# Patient Record
Sex: Female | Born: 1965
Health system: Southern US, Community
[De-identification: ages and names within clinical notes are randomized; demographics above are authoritative.]

## PROBLEM LIST (undated history)

## (undated) DIAGNOSIS — B009 Herpesviral infection, unspecified: Secondary | ICD-10-CM

## (undated) DIAGNOSIS — F419 Anxiety disorder, unspecified: Secondary | ICD-10-CM

## (undated) DIAGNOSIS — G5603 Carpal tunnel syndrome, bilateral upper limbs: Secondary | ICD-10-CM

## (undated) DIAGNOSIS — E785 Hyperlipidemia, unspecified: Secondary | ICD-10-CM

## (undated) DIAGNOSIS — I10 Essential (primary) hypertension: Secondary | ICD-10-CM

## (undated) DIAGNOSIS — N898 Other specified noninflammatory disorders of vagina: Secondary | ICD-10-CM

## (undated) DIAGNOSIS — R300 Dysuria: Secondary | ICD-10-CM

## (undated) DIAGNOSIS — IMO0002 Reserved for concepts with insufficient information to code with codable children: Secondary | ICD-10-CM

## (undated) DIAGNOSIS — C801 Malignant (primary) neoplasm, unspecified: Secondary | ICD-10-CM

## (undated) DIAGNOSIS — R87629 Unspecified abnormal cytological findings in specimens from vagina: Secondary | ICD-10-CM

## (undated) DIAGNOSIS — Z8719 Personal history of other diseases of the digestive system: Secondary | ICD-10-CM

## (undated) DIAGNOSIS — A630 Anogenital (venereal) warts: Secondary | ICD-10-CM

## (undated) DIAGNOSIS — R87619 Unspecified abnormal cytological findings in specimens from cervix uteri: Secondary | ICD-10-CM

## (undated) DIAGNOSIS — E559 Vitamin D deficiency, unspecified: Principal | ICD-10-CM

## (undated) DIAGNOSIS — K5792 Diverticulitis of intestine, part unspecified, without perforation or abscess without bleeding: Secondary | ICD-10-CM

## (undated) HISTORY — PX: MOLE REMOVAL: SHX2046

## (undated) HISTORY — DX: Hyperlipidemia, unspecified: E78.5

## (undated) HISTORY — PX: EXPLORATORY LAPAROTOMY: SUR591

## (undated) HISTORY — DX: Anogenital (venereal) warts: A63.0

## (undated) HISTORY — DX: Diverticulitis of intestine, part unspecified, without perforation or abscess without bleeding: K57.92

## (undated) HISTORY — DX: Essential (primary) hypertension: I10

## (undated) HISTORY — DX: Unspecified abnormal cytological findings in specimens from vagina: R87.629

## (undated) HISTORY — DX: Herpesviral infection, unspecified: B00.9

## (undated) HISTORY — PX: TONSILLECTOMY: SUR1361

## (undated) HISTORY — DX: Dysuria: R30.0

## (undated) HISTORY — PX: TUBAL LIGATION: SHX77

## (undated) HISTORY — DX: Unspecified abnormal cytological findings in specimens from cervix uteri: R87.619

## (undated) HISTORY — PX: COLPOSCOPY W/ BIOPSY / CURETTAGE: SUR283

## (undated) HISTORY — DX: Reserved for concepts with insufficient information to code with codable children: IMO0002

## (undated) HISTORY — DX: Other specified noninflammatory disorders of vagina: N89.8

## (undated) HISTORY — DX: Carpal tunnel syndrome, bilateral upper limbs: G56.03

## (undated) HISTORY — DX: Vitamin D deficiency, unspecified: E55.9

---

## 2005-11-01 ENCOUNTER — Ambulatory Visit: Payer: Self-pay | Admitting: Internal Medicine

## 2005-11-03 ENCOUNTER — Ambulatory Visit (HOSPITAL_COMMUNITY): Admission: RE | Admit: 2005-11-03 | Discharge: 2005-11-03 | Payer: Self-pay | Admitting: Gastroenterology

## 2005-11-10 ENCOUNTER — Ambulatory Visit: Payer: Self-pay | Admitting: Gastroenterology

## 2005-11-10 ENCOUNTER — Ambulatory Visit (HOSPITAL_COMMUNITY): Admission: RE | Admit: 2005-11-10 | Discharge: 2005-11-10 | Payer: Self-pay | Admitting: Gastroenterology

## 2005-12-01 ENCOUNTER — Ambulatory Visit (HOSPITAL_COMMUNITY): Admission: RE | Admit: 2005-12-01 | Discharge: 2005-12-01 | Payer: Self-pay | Admitting: Obstetrics and Gynecology

## 2006-05-13 ENCOUNTER — Ambulatory Visit: Payer: Self-pay | Admitting: Cardiology

## 2006-12-21 ENCOUNTER — Other Ambulatory Visit: Admission: RE | Admit: 2006-12-21 | Discharge: 2006-12-21 | Payer: Self-pay | Admitting: Obstetrics and Gynecology

## 2009-03-29 HISTORY — PX: COLONOSCOPY WITH ESOPHAGOGASTRODUODENOSCOPY (EGD): SHX5779

## 2009-08-20 ENCOUNTER — Other Ambulatory Visit: Admission: RE | Admit: 2009-08-20 | Discharge: 2009-08-20 | Payer: Self-pay | Admitting: Obstetrics and Gynecology

## 2009-08-26 ENCOUNTER — Ambulatory Visit (HOSPITAL_COMMUNITY): Admission: RE | Admit: 2009-08-26 | Discharge: 2009-08-26 | Payer: Self-pay | Admitting: Obstetrics and Gynecology

## 2009-09-15 DIAGNOSIS — R1013 Epigastric pain: Secondary | ICD-10-CM

## 2009-09-16 ENCOUNTER — Encounter: Payer: Self-pay | Admitting: Gastroenterology

## 2009-09-16 ENCOUNTER — Ambulatory Visit: Payer: Self-pay | Admitting: Gastroenterology

## 2009-09-16 DIAGNOSIS — K921 Melena: Secondary | ICD-10-CM

## 2009-09-22 ENCOUNTER — Encounter: Payer: Self-pay | Admitting: Internal Medicine

## 2009-09-22 ENCOUNTER — Telehealth (INDEPENDENT_AMBULATORY_CARE_PROVIDER_SITE_OTHER): Payer: Self-pay

## 2009-09-23 ENCOUNTER — Ambulatory Visit: Payer: Self-pay | Admitting: Gastroenterology

## 2009-09-23 ENCOUNTER — Ambulatory Visit (HOSPITAL_COMMUNITY): Admission: RE | Admit: 2009-09-23 | Discharge: 2009-09-23 | Payer: Self-pay | Admitting: Gastroenterology

## 2009-09-24 ENCOUNTER — Encounter (INDEPENDENT_AMBULATORY_CARE_PROVIDER_SITE_OTHER): Payer: Self-pay | Admitting: *Deleted

## 2010-02-25 ENCOUNTER — Other Ambulatory Visit
Admission: RE | Admit: 2010-02-25 | Discharge: 2010-02-25 | Payer: Self-pay | Source: Home / Self Care | Admitting: Obstetrics and Gynecology

## 2010-04-28 NOTE — Letter (Signed)
Summary: TCS/EGD ORDER  TCS/EGD ORDER   Imported By: Ave Filter 09/16/2009 11:56:27  _____________________________________________________________________  External Attachment:    Type:   Image     Comment:   External Document

## 2010-04-28 NOTE — Letter (Signed)
Summary: [facility] note[facility]  [facility]   Imported By: Rosine Beat 09/22/2009 10:48:09  _____________________________________________________________________  External Attachment:    Type:   Image     Comment:   External Document

## 2010-04-28 NOTE — Letter (Signed)
Summary: Internal Other  Internal Other   Imported By: Peggyann Shoals 09/16/2009 14:01:43  _____________________________________________________________________  External Attachment:    Type:   Image     Comment:   External Document

## 2010-04-28 NOTE — Progress Notes (Signed)
Summary: phone note/ need alternative prep  Phone Note From Pharmacy   Caller: Autumn @ [facility] Call For: pt  Summary of Call: Pharmacist called and requested alternative Prep to [facility]....they do not have in stock and pt said she needs today. Please advise!   [phone]   Initial call taken by: Cloria Spring LPN,  September 22, 2009 10:35 AM     Appended Document: phone note/ need alternative prep SUPREP.  Appended Document: phone note/ need alternative prep Pt informed. [facility] informed we are giving a prep. Pt's Mom came by and picked up prep and instructions.  Appended Document: phone note/ need alternative prep Please call pt. Her blood count is normal. Will call her when biopsies results come back.  Appended Document: phone note/ need alternative prep Called, above number [phone], not her number.  Appended Document: phone note/ need alternative prep Called work number and no answer.  Appended Document: phone note/ need alternative prep Pt informed.

## 2010-04-28 NOTE — Assessment & Plan Note (Signed)
Summary: heme+stools/family hx of colon cancer/ss   Visit Type:  Initial Consult Referring Provider:  Cyril Mourning Primary Care Provider:  Sudie Bailey  Chief Complaint:  Heme pos stools/ fh colon cancer.  History of Present Illness: Ms. Frandsen is a pleasant 45 y/o female who presents at request of Cyril Mourning for further evalatuion of heme positive stool, FH of CRC. Patient was last seen in 2007 at time of EGD/TCS. See PMH for details. She denies brbpr. Recent DRE showed heme positive stool. Had recent constipation on hydrocodone. Recent spider bite, ED evaluation at [facility] July 12th, repeating bloodwork because LFTs and WBC were up after starting Abx for spider bite. Now BMs good. No melena. No heartburn, dysphagia, abd pain, no weight loss.   Current Medications (verified): 1)  Septra Ds 800-160 Mg Tabs (Sulfamethoxazole-Trimethoprim) .... One Tablet Twice A Day For 21 Days  For Spider Bite 2)  Bp Pill .... One Tablet Daily  Allergies (verified): No Known Drug Allergies  Past History:  Past Medical History: Hypertension EGD/TCS 8/07-->frequent sigmoid diverticula, 2-3cm hiatal hernia Abnormal pap, 5/11  Past Surgical History: C-Section times two Tonsillectomy Tubal Ligation Exp lap, for pelvic pain.  Family History: Father, colon cancer, age 50. Daughter, pituitary tumor, age 66, treated with medication, now age 4 doing well.  Social History: Married. Two children, son age 46, dgt, age 17. No tob, alcohol, drug use. Works at [employer], for 22 years. Husband out of work secondary to seizures for two years.   Review of Systems General:  Denies fever, chills, sweats, anorexia, fatigue, weakness, and weight loss. Eyes:  Denies vision loss. ENT:  Denies nasal congestion, sore throat, hoarseness, and difficulty swallowing. CV:  Denies chest pains, angina, palpitations, dyspnea on exertion, and peripheral edema. Resp:  Denies dyspnea at rest, dyspnea with exercise,  cough, sputum, and wheezing. GI:  See HPI. GU:  Denies urinary burning and blood in urine. MS:  Denies joint pain / LOM. Derm:  Denies rash and itching. Neuro:  Denies weakness, frequent headaches, memory loss, and confusion. Psych:  Denies depression and anxiety. Endo:  Denies unusual weight change. Heme:  Denies bruising and bleeding. Allergy:  Denies hives, rash, and sneezing.  Vital Signs:  Patient profile:   45 year old female Height:      62.5 inches Weight:      137.50 pounds BMI:     24.84 Temp:     98.5 degrees F oral Pulse rate:   72 / minute BP sitting:   120 / 70  (left arm) Cuff size:   regular  Vitals Entered By: Cloria Spring LPN (September 16, 2009 10:13 AM)  Physical Exam  General:  Well developed, well nourished, no acute distress. Head:  Normocephalic and atraumatic. Eyes:  Conjunctivae pink, no scleral icterus.  Mouth:  Oropharyngeal mucosa moist, pink.  No lesions, erythema or exudate.    Neck:  Supple; no masses or thyromegaly. Lungs:  Clear throughout to auscultation. Heart:  Regular rate and rhythm; no murmurs, rubs,  or bruits. Abdomen:  Bowel sounds normal.  Abdomen is soft, nontender, nondistended.  No rebound or guarding.  No hepatosplenomegaly, masses or hernias.  No abdominal bruits.  Rectal:  deferred until time of colonoscopy.   Extremities:  No clubbing, cyanosis, edema or deformities noted. Neurologic:  Alert and  oriented x4;  grossly normal neurologically. Skin:  Intact without significant lesions or rashes. Cervical Nodes:  No significant cervical adenopathy. Psych:  Alert and cooperative. Normal mood and  affect.  Impression & Recommendations:  Problem # 1:  HEMOCCULT POSITIVE STOOL (ICD-578.1)  Recent heme positive stool. FH CRC, father age 54. Her last TCS was four years ago. At this time recommend colonoscopy. If negative, consider EGD at same time. Colonoscopy+/-EGD to be performed in near future.  Risks, alternatives, and benefits  including but not limited to the risk of reaction to medication, bleeding, infection, and perforation were addressed.  Patient voiced understanding and provided verbal consent.  Retrieve labs from [facility] for review. Patient reports LFTS abnormal recently, scheduled to have rechecked in next few weeks.   Orders: Consultation Level III (64403) I would like to thank Cyril Mourning, NP for allowing [place] to take part in the care of this nice patient.

## 2010-04-28 NOTE — Letter (Signed)
Summary: Internal Other/ Prep instructions for Dr. Jonette Eva procedure  Internal Other/ Prep instructions   Imported By: Cloria Spring LPN 86/57/8469 62:95:28  _____________________________________________________________________  External Attachment:    Type:   Image     Comment:   External Document

## 2010-04-28 NOTE — Letter (Signed)
Summary: Internal Other /Suprep instructions  Internal Other /Suprep instructions   Imported By: Cloria Spring LPN 09/81/1914 78:29:56  _____________________________________________________________________  External Attachment:    Type:   Image     Comment:   External Document

## 2010-06-14 LAB — DIFFERENTIAL
Basophils Relative: 1 % (ref 0–1)
Lymphocytes Relative: 42 % (ref 12–46)
Lymphs Abs: 1.8 10*3/uL (ref 0.7–4.0)
Monocytes Relative: 8 % (ref 3–12)
Neutro Abs: 2.1 10*3/uL (ref 1.7–7.7)
Neutrophils Relative %: 48 % (ref 43–77)

## 2010-06-14 LAB — CBC
MCH: 29.7 pg (ref 26.0–34.0)
RDW: 13.6 % (ref 11.5–15.5)
WBC: 4.4 10*3/uL (ref 4.0–10.5)

## 2010-08-14 NOTE — Consult Note (Signed)
Shannon Lindsey, Shannon Lindsey               ACCOUNT NO.:  [id number]   MEDICAL RECORD NO.:  [id number]          PATIENT TYPE:  AMB   LOCATION:  DAY                           FACILITY:  [facility]   PHYSICIAN:  Kassie Mends, M.D.      DATE OF BIRTH:  11-Jun-1965   DATE OF CONSULTATION:  11/01/2005  DATE OF DISCHARGE:                                   CONSULTATION   REASON FOR CONSULTATION:  Epigastric pain, refractory heartburn and  indigestion.   HISTORY OF PRESENT ILLNESS:  Shannon Lindsey is a 45 year old Caucasian female  who states, over the last 8-9 months, she has had a nagging epigastric pain.  She states it feels as, there is something there.  She also describes it  as a pressure.  It radiates through to her back.  Pain is generally worse  postprandially, however the pain is fairly constant.  She rates is 4-5 on a  pain scale.  It is worse when she gets upset.  She states certain foods  definitely make it worse as well.  She has noted some problems swallowing  bread.  She feels as though it has difficulty passing through her lower  esophagus.  She denies any problems with regurgitation, nausea or vomiting,  denies any anorexia or early satiety.  Her bowels generally move every day  or every other day.  She denies any rectal bleeding or melena.  She does  have a family history for her father diagnosed with colon cancer at age 21.   PAST MEDICAL HISTORY:  1. C-section times two.  2. Tonsillectomy.  3. Tubal ligation.   CURRENT MEDICATIONS:  Denies any.   ALLERGIES:  NO KNOWN DRUG ALLERGIES.   FAMILY HISTORY:  Positive for father diagnosed at age 23.  Mother, age 42,  has history of hypertension.  She has 3 siblings who are healthy.  One  daughter with a pituitary tumor.   SOCIAL HISTORY:  Shannon Lindsey has been married for 18 years.  She has two  children, ages 15 and 32.  She is employed full time. She denies any tobacco  or alcohol or current drug use.   REVIEW OF SYSTEMS:   CONSTITUTIONAL:  Her weight is steadily increasing.  Denies any fever or chills.  CARDIOVASCULAR:  Denies chest pain or  palpitations.  PULMONARY:  Denies any shortness of breath, dyspnea, cough or  hemoptysis.  GI:  See HPI.   PHYSICAL EXAMINATION:  VITAL SIGNS:  Weight 148 pounds, height 63 inches,  temp 98.2, blood pressure 122/84, and pulse 72.  GENERAL:  Shannon Lindsey is a 45 year old Caucasian female who is alert,  oriented, pleasant, cooperative, and in no acute distress.  HEENT:  Pupils  equal and reactive.  Conjunctivae pink.  Oropharynx pink and moist without  any lesions.  NECK:  Supple without mass or thyromegaly.  CHEST:  Regular rate and rhythm,  normal S1-S2, without murmurs, clicks, rubs or gallops.  Lungs clear to  auscultation bilaterally.  ABDOMEN:  Positive bowel sounds times four.  No  bruits auscultated.  Soft, nondistended.  She does have  moderate tenderness  in the epigastrium on deep palpation.  There is no rebound tenderness or  guarding.  She also has right lower quadrant tenderness on deep palpation.  She has negative Murphy's sign, no hepatosplenomegaly or mass.  EXTREMITIES:  Without clubbing or edema bilaterally.   IMPRESSION:  Shannon Lindsey is a 45 year old Caucasian female with a 9 month  history of constant epigastric pressure.  She has worsened pain  postprandially.  She also describes daily heartburn and indigestion.  The  pain radiates through to her back.  I suspect she may have gallbladder  etiology, given her tenderness on exam, and I feel this should be ruled out.  Other possibilities include gastroesophageal reflux disease as the culprit  of her symptoms.  She has a family history of colon cancer, and is due for  colonoscopy at this time.   PLAN:  1. Abdominal ultrasound.  If negative, will proceed with EGD.  2. Colonoscopy plus/minus EGD in the near future with Dr. Cira Servant.  I have      discussed this procedure, including risks and benefits which  include,      but are not limited to, bleeding, infection, perforation and drug      reaction.  She agrees with plan and consent will be obtained.  3. Trial of Nexium 40 mg daily, and she is going to let us know how this      goes.  If she does not have 100% relief of her symptoms, and if her      gallbladder ultrasound is negative, we will proceed with EGD at the      time of colonoscopy.   We would like to thank Dr. Wende Crease for allowing [place] to participate in the  care of Shannon Lindsey.      Shannon Lindsey, N.P.      Kassie Mends, M.D.  Electronically Signed    KC/MEDQ  D:  11/01/2005  T:  11/01/2005  Job:  [id number]   cc:   Laverle Hobby, M.D.  [address]  [place], [place] [address]   Kassie Mends, M.D.  [address]  [place] , [place] [address]   Mila Homer. Sudie Bailey, M.D.  Fax: [phone]

## 2010-08-14 NOTE — Assessment & Plan Note (Signed)
[facility]                          [facility] OFFICE NOTE   NAME:Shannon Lindsey, Bittinger                      MRN:          [id number]  DATE:05/13/2006                            DOB:          08-13-1965    REFERRING PHYSICIAN:  Mila Homer. Sudie Bailey, M.D.   REASON FOR VISIT:  Atypical chest pain.   HISTORY OF PRESENT ILLNESS:  The patient is a 45 year old female with a  history of hypertension and family history of coronary artery disease.  The patient was seen in the emergency room on February 12, 2006 with  left-sided chest pain originating under the left breast and going up  into the left arm and shoulder. The patient at that time was ruled out  for myocardial infarction. EKG was negative for ischemia.   She presents now for followup with recurrent pain in the left side of  the chest. The pain is very infrequent and is not exertional in nature.  There is also no associated shortness of breath. The patient  occasionally has heartburn which appears to be separate from her  symptoms which seems to occur mainly when she is anxious or very upset.  She has no exertional symptoms.   MEDICATIONS:  Amlodipine 10 mg p.o. daily.   PAST MEDICAL HISTORY:  1. Notable for hypertension.  2. Occasional tobacco use.   SOCIAL HISTORY:  The patient lives with her husband. She occasionally  smokes. She does not drink alcohol.   FAMILY HISTORY:  The brother has a history of myocardial infarction x2  at the age 69 but he smokes.   PAST MEDICAL HISTORY:  See below.   REVIEW OF SYSTEMS:  As per HPI. The patient denies any nausea or  vomiting, no fever or chills. No orthopnea or PND, no palpitations or  syncope. The remainder of the review of systems is negative.   PHYSICAL EXAMINATION:  VITAL SIGNS:  Blood pressure 130/89, heart rate  85 beats per minute. Weighs 153 pounds.  NECK:  Normal carotid upstrokes, no carotid bruits.  LUNGS:  Clear breath sounds  bilaterally.  HEART:  Regular rate and rhythm, normal S1, S2. No murmurs, rubs or  gallops.  ABDOMEN:  Soft, nontender, no rebound or guarding. Good bowel sounds.  EXTREMITIES:  No cyanosis, clubbing or edema.  NEUROLOGIC:  The patient is alert and oriented and grossly nonfocal.   PROBLEM LIST:  1. Atypical chest pain.  2. Hypertension, poorly controlled.  3. Occasional tobacco use.  4. Family history of coronary artery disease.   PLAN:  1. The patient's symptoms are rather atypical for coronary artery      disease. Will plan however to proceed with a stress      echocardiographic study.  2. The patient can receive further followup by Dr. Sudie Bailey regarding      the possibility of GI related symptoms.  3. I will relay the results of the stress study to the patient and      decide on further followup pending the results.     Learta Codding, MD,FACC  Electronically Signed    GED/MedQ  DD: 05/13/2006  DT: 05/13/2006  Job #: [id number]   cc:   Mila Homer. Sudie Bailey, M.D.

## 2010-08-14 NOTE — Op Note (Signed)
NAMESHERYL, SAINTIL               ACCOUNT NO.:  [id number]   MEDICAL RECORD NO.:  [id number]          PATIENT TYPE:  AMB   LOCATION:  DAY                           FACILITY:  [facility]   PHYSICIAN:  Kassie Mends, M.D.      DATE OF BIRTH:  22-Nov-1965   DATE OF PROCEDURE:  11/10/2005  DATE OF DISCHARGE:                                 OPERATIVE REPORT   REFERRING PHYSICIAN:  Laverle Hobby, M.D.   PROCEDURES:  1. Colonoscopy.  2. Esophagogastroduodenoscopy.   MEDICATIONS:  1. Demerol 75 mg IV.  2. Versed 4 mg IV.   INDICATIONS FOR PROCEDURE:  Ms. Harshman is a 45 year old female with a  history of colon cancer in her father diagnosed at age 30.  She also has  refractory new-onset gastroesophageal reflux disease.   FINDINGS:  1. Frequent sigmoid diverticula.  Otherwise normal colon without mass,      inflammatory changes or vascular ectasia.  Normal retroflexed view of      the rectum.  2. A 2-3 cm hiatal hernia.  Otherwise normal esophagus without evidence of      mass, inflammatory changes or Barrett's esophagus.  Normal stomach,      pylorus and duodenum.   RECOMMENDATIONS:  1. Screening colonoscopy in 5 years.  Her children should have colon      cancer screening beginning at age 61.  2. High-fiber diet.  Handout given on diverticulosis and high-fiber diet.  3. Continue Nexium once daily.  4. Follow up with Dr. Sudie Bailey.   PROCEDURE TECHNIQUE:  A physical exam was performed and informed consent was  obtained from the patient after explaining all risks, benefits and  alternatives to the procedure, which the patient appeared to understand and  so stated.  The patient was connected to the monitoring device and placed in  the left lateral position.  Continuous oxygen was provided by nasal cannula  and IV medicine administered through an indwelling cannula.  After  administration of sedation and rectal exam, the patient's rectum was  intubated.  The scope was advanced under direct  visualization to the cecum.  The scope was subsequently removed slowly by carefully examining the color,  texture, anatomy and integrity of the mucosa on the way out.   After the colonoscopy, the patient was turned around.  Her esophagus was  intubated with a diagnostic gastroscope and advanced under direct  visualization to the second portion of the duodenum.  The scope was  subsequently removed slowly by carefully examining color, texture, anatomy  and integrity of the mucosa on the way out.  The patient was recovered in  endoscopy suite and discharged home in satisfactory condition.      Kassie Mends, M.D.  Electronically Signed     SM/MEDQ  D:  11/10/2005  T:  11/10/2005  Job:  [id number]   cc:   Mila Homer. Sudie Bailey, M.D.  Fax: [phone]

## 2010-10-28 ENCOUNTER — Encounter: Payer: Self-pay | Admitting: Gastroenterology

## 2010-11-11 ENCOUNTER — Other Ambulatory Visit: Payer: Self-pay | Admitting: Adult Health

## 2010-11-11 ENCOUNTER — Other Ambulatory Visit (HOSPITAL_COMMUNITY)
Admission: RE | Admit: 2010-11-11 | Discharge: 2010-11-11 | Disposition: A | Discharge status: Home / Self Care | Payer: BC Managed Care – PPO | Source: Ambulatory Visit | Attending: Obstetrics and Gynecology | Admitting: Obstetrics and Gynecology

## 2010-11-11 DIAGNOSIS — Z01419 Encounter for gynecological examination (general) (routine) without abnormal findings: Secondary | ICD-10-CM | POA: Insufficient documentation

## 2010-11-12 ENCOUNTER — Other Ambulatory Visit: Payer: Self-pay | Admitting: Obstetrics and Gynecology

## 2010-11-12 DIAGNOSIS — Z139 Encounter for screening, unspecified: Secondary | ICD-10-CM

## 2010-11-19 ENCOUNTER — Ambulatory Visit (HOSPITAL_COMMUNITY)
Admission: RE | Admit: 2010-11-19 | Discharge: 2010-11-19 | Disposition: A | Discharge status: Home / Self Care | Payer: BC Managed Care – PPO | Source: Ambulatory Visit | Attending: Obstetrics and Gynecology | Admitting: Obstetrics and Gynecology

## 2010-11-19 DIAGNOSIS — Z139 Encounter for screening, unspecified: Secondary | ICD-10-CM

## 2010-11-19 DIAGNOSIS — Z1231 Encounter for screening mammogram for malignant neoplasm of breast: Secondary | ICD-10-CM | POA: Insufficient documentation

## 2010-11-27 ENCOUNTER — Other Ambulatory Visit: Payer: Self-pay | Admitting: Obstetrics and Gynecology

## 2011-05-24 ENCOUNTER — Other Ambulatory Visit: Payer: Self-pay | Admitting: Adult Health

## 2011-05-24 ENCOUNTER — Other Ambulatory Visit (HOSPITAL_COMMUNITY)
Admission: RE | Admit: 2011-05-24 | Discharge: 2011-05-24 | Disposition: A | Discharge status: Home / Self Care | Payer: BC Managed Care – PPO | Source: Ambulatory Visit | Attending: Obstetrics and Gynecology | Admitting: Obstetrics and Gynecology

## 2011-05-24 DIAGNOSIS — Z01419 Encounter for gynecological examination (general) (routine) without abnormal findings: Secondary | ICD-10-CM | POA: Insufficient documentation

## 2011-06-15 ENCOUNTER — Other Ambulatory Visit: Payer: Self-pay | Admitting: Obstetrics and Gynecology

## 2011-11-03 ENCOUNTER — Other Ambulatory Visit: Payer: Self-pay | Admitting: Adult Health

## 2011-11-03 ENCOUNTER — Other Ambulatory Visit (HOSPITAL_COMMUNITY)
Admission: RE | Admit: 2011-11-03 | Discharge: 2011-11-03 | Disposition: A | Discharge status: Home / Self Care | Payer: BC Managed Care – PPO | Source: Ambulatory Visit | Attending: Obstetrics and Gynecology | Admitting: Obstetrics and Gynecology

## 2011-11-03 DIAGNOSIS — Z01419 Encounter for gynecological examination (general) (routine) without abnormal findings: Secondary | ICD-10-CM | POA: Insufficient documentation

## 2011-11-03 DIAGNOSIS — Z1151 Encounter for screening for human papillomavirus (HPV): Secondary | ICD-10-CM | POA: Insufficient documentation

## 2011-11-26 ENCOUNTER — Other Ambulatory Visit: Payer: Self-pay | Admitting: Adult Health

## 2011-11-26 DIAGNOSIS — Z139 Encounter for screening, unspecified: Secondary | ICD-10-CM

## 2011-12-02 ENCOUNTER — Ambulatory Visit (HOSPITAL_COMMUNITY): Admission: RE | Admit: 2011-12-02 | Payer: BC Managed Care – PPO | Source: Ambulatory Visit

## 2012-01-04 ENCOUNTER — Ambulatory Visit (HOSPITAL_COMMUNITY): Payer: BC Managed Care – PPO

## 2012-04-10 ENCOUNTER — Ambulatory Visit (HOSPITAL_COMMUNITY)
Admission: RE | Admit: 2012-04-10 | Discharge: 2012-04-10 | Disposition: A | Discharge status: Home / Self Care | Payer: BC Managed Care – PPO | Source: Ambulatory Visit | Attending: Adult Health | Admitting: Adult Health

## 2012-04-10 DIAGNOSIS — Z1231 Encounter for screening mammogram for malignant neoplasm of breast: Secondary | ICD-10-CM | POA: Insufficient documentation

## 2012-04-10 DIAGNOSIS — Z139 Encounter for screening, unspecified: Secondary | ICD-10-CM

## 2012-07-11 ENCOUNTER — Encounter: Payer: Self-pay | Admitting: *Deleted

## 2012-07-11 DIAGNOSIS — B009 Herpesviral infection, unspecified: Secondary | ICD-10-CM

## 2012-07-11 DIAGNOSIS — I10 Essential (primary) hypertension: Secondary | ICD-10-CM | POA: Insufficient documentation

## 2012-07-12 ENCOUNTER — Encounter: Payer: Self-pay | Admitting: Adult Health

## 2012-07-12 ENCOUNTER — Ambulatory Visit (INDEPENDENT_AMBULATORY_CARE_PROVIDER_SITE_OTHER): Payer: BC Managed Care – PPO | Admitting: Adult Health

## 2012-07-12 ENCOUNTER — Other Ambulatory Visit (HOSPITAL_COMMUNITY)
Admission: RE | Admit: 2012-07-12 | Discharge: 2012-07-12 | Disposition: A | Discharge status: Home / Self Care | Payer: BC Managed Care – PPO | Source: Ambulatory Visit | Attending: Adult Health | Admitting: Adult Health

## 2012-07-12 VITALS — BP 112/70 | HR 70 | Ht 63.0 in | Wt 138.0 lb

## 2012-07-12 DIAGNOSIS — A630 Anogenital (venereal) warts: Secondary | ICD-10-CM

## 2012-07-12 DIAGNOSIS — Z0000 Encounter for general adult medical examination without abnormal findings: Secondary | ICD-10-CM

## 2012-07-12 DIAGNOSIS — Z87898 Personal history of other specified conditions: Secondary | ICD-10-CM

## 2012-07-12 DIAGNOSIS — I10 Essential (primary) hypertension: Secondary | ICD-10-CM

## 2012-07-12 DIAGNOSIS — Z01419 Encounter for gynecological examination (general) (routine) without abnormal findings: Secondary | ICD-10-CM | POA: Insufficient documentation

## 2012-07-12 DIAGNOSIS — Z1151 Encounter for screening for human papillomavirus (HPV): Secondary | ICD-10-CM | POA: Insufficient documentation

## 2012-07-12 DIAGNOSIS — Z1212 Encounter for screening for malignant neoplasm of rectum: Secondary | ICD-10-CM

## 2012-07-12 LAB — HEMOCCULT GUIAC POC 1CARD (OFFICE): Fecal Occult Blood, POC: NEGATIVE

## 2012-07-12 NOTE — Progress Notes (Signed)
Patient ID: Shannon Lindsey, female   DOB: 26-May-1965, 47 y.o.   MRN: [id number] History of Present Illness:Shannon Lindsey is a 47 year old white female in for pap and physical.     Current Medications, Allergies, Past Medical History, Past Surgical History, Family History and Social History were reviewed in [employer] [facility] medical record.     Review of Systems:Patient denies any headaches, blurred vision, shortness of breath, chest pain, abdominal pain, problems with bowel movements, urination, or intercourse. She has no muscle aches or swelling. She is moody at times and says her daughter is going to [facility] in the fall and she is happy but will miss her a lot. She thinks her warts are back.  Physical Exam:Blood pressure 112/70, pulse 70, height 5\' 3"  (1.6 m), weight 138 lb (62.596 kg). General:  Well developed, well nourished, no acute distress Skin:  Warm and dry Neck:  Midline trachea, normal thyroid Lungs; Clear to auscultation bilaterally Breast:  No dominant palpable mass, retraction, or nipple discharge Cardiovascular: Regular rate and rhythm Abdomen:  Soft, non tender, no hepatosplenomegaly Pelvic:  External genitalia is normal in appearance.  The vagina is normal in appearance. The cervix is bulbous. Pap performed with HPV.  Uterus is felt to be normal size, shape, and contour.  No adnexal masses or tenderness noted. Rectal: Good sphincter tone, no polyps, or hemorrhoids felt.  Hemoccult negative.she has  2 small warts near anal opening and TCA 85% was applied with Q tip. Extremities:  No swelling or varicosities noted Psych: Alert and cooperative, moody at times  Impression: Yearly exam History abnormal pap History of hypertension Warts  Plan:Wash TCA  Off in 2 hours Mammogram yearly Colonoscopy 2016  Physical in 1 year

## 2012-07-12 NOTE — Patient Instructions (Addendum)
Mammogram yearly Wash  tca off in 2 hours Colonoscopy in 2016 Physical in 1 year  Sign up for my chart

## 2012-12-15 ENCOUNTER — Encounter (INDEPENDENT_AMBULATORY_CARE_PROVIDER_SITE_OTHER): Payer: Self-pay | Admitting: General Surgery

## 2012-12-15 ENCOUNTER — Ambulatory Visit (INDEPENDENT_AMBULATORY_CARE_PROVIDER_SITE_OTHER): Payer: BC Managed Care – PPO | Admitting: General Surgery

## 2012-12-15 VITALS — BP 118/66 | HR 80 | Resp 14 | Ht 63.0 in | Wt 144.2 lb

## 2012-12-15 DIAGNOSIS — C4492 Squamous cell carcinoma of skin, unspecified: Secondary | ICD-10-CM | POA: Insufficient documentation

## 2012-12-15 NOTE — Progress Notes (Signed)
Patient ID: Shannon Lindsey, female   DOB: 10/30/1965, 47 y.o.   MRN: [id number]  Chief Complaint  Patient presents with  . New Evaluation    eval carcinoma on the rt chest    HPI Shannon Lindsey is a 47 y.o. female.  Chief complaint: Squamous cell carcinoma right chest HPI The patient noticed a small blister on her right upper chest several weeks ago. It was itchy and occasionally bled. It gradually got larger. It persisted for 6 weeks so she was evaluated by Dr. Margo Aye from dermatology. Shave biopsy was done which revealed moderately differentiated squamous cell carcinoma. Margins were free. I was asked to see her in consultation for consideration of reexcision. She is not having any difficulties with wound healing. She is keeping Neosporin on it and usually keeping it covered.  Past Medical History  Diagnosis Date  . HSV-2 (herpes simplex virus 2) infection   . Hypertension   . HSV-1 (herpes simplex virus 1) infection   . Abnormal Pap smear   . Warts, genital     Past Surgical History  Procedure Laterality Date  . Cesarean section    . Tonsillectomy    . Exploratory laparotomy    . Colposcopy w/ biopsy / curettage    . Tubal ligation    . Mole removal      has had 30 + removed not cancer    Family History  Problem Relation Age of Onset  . Cancer Father     colon  . Diabetes Other   . Atrial fibrillation Mother   . Stroke Sister     Social History History  Substance Use Topics  . Smoking status: Never Smoker   . Smokeless tobacco: Never Used  . Alcohol Use: Yes     Comment: occ.    Allergies  Allergen Reactions  . Erythromycin Other (See Comments)    Gets a UTI every time.    Current Outpatient Prescriptions  Medication Sig Dispense Refill  . lisinopril-hydrochlorothiazide (PRINZIDE,ZESTORETIC) 20-12.5 MG per tablet Take 1 tablet by mouth daily.      . valACYclovir (VALTREX) 500 MG tablet Take 500 mg by mouth as needed.       No current facility-administered  medications for this visit.    Review of Systems Review of Systems  Constitutional: Negative for fever, chills and unexpected weight change.  HENT: Negative for hearing loss, congestion, sore throat, trouble swallowing and voice change.   Eyes: Negative for visual disturbance.  Respiratory: Negative for cough and wheezing.   Cardiovascular: Negative for chest pain, palpitations and leg swelling.  Gastrointestinal: Negative for nausea, vomiting, abdominal pain, diarrhea, constipation, blood in stool, abdominal distention and anal bleeding.  Genitourinary: Negative for hematuria, vaginal bleeding and difficulty urinating.  Musculoskeletal: Negative for arthralgias.  Skin: Positive for wound. Negative for rash.       The history of present illness  Neurological: Negative for seizures, syncope and headaches.  Hematological: Negative for adenopathy. Does not bruise/bleed easily.  Psychiatric/Behavioral: Negative for confusion.    Blood pressure 118/66, pulse 80, resp. rate 14, height 5\' 3"  (1.6 m), weight 144 lb 3.2 oz (65.409 kg).  Physical Exam Physical Exam  Constitutional: She is oriented to person, place, and time. She appears well-developed and well-nourished. No distress.  HENT:  Head: Normocephalic and atraumatic.  Nose: Nose normal.  Mouth/Throat: No oropharyngeal exudate.  Eyes: EOM are normal. Pupils are equal, round, and reactive to light. No scleral icterus.  Neck: Normal range  of motion. Neck supple. No tracheal deviation present.  Cardiovascular: Normal rate, regular rhythm, normal heart sounds and intact distal pulses.   Pulmonary/Chest: Effort normal and breath sounds normal. No stridor. No respiratory distress. She has no wheezes. She has no rales.    Biopsy site right upper chest, no evidence of infection  Abdominal: Soft. She exhibits no distension. There is no tenderness. There is no rebound.  Musculoskeletal: She exhibits no edema and no tenderness.    Neurological: She is alert and oriented to person, place, and time. She exhibits normal muscle tone. Coordination normal.  Lymphatic exam: No palpable bilateral axillary, bilateral cervical,  Or supraclavicular lymphadenopathy  Data Reviewed Pathology report as above  Assessment    Squamous cell carcinoma right upper chest, status post shave biopsy    Plan    I have recommended wide reexcision with 0.5 cm margins to give her the best chance of cure. Procedure, risks, and benefits were discussed in detail with the patient. She is agreeable.       Khrystyna Schwalm E 12/15/2012, 2:30 PM

## 2012-12-18 ENCOUNTER — Encounter (HOSPITAL_COMMUNITY): Payer: Self-pay | Admitting: Pharmacy Technician

## 2012-12-22 ENCOUNTER — Encounter (HOSPITAL_COMMUNITY)
Admission: RE | Admit: 2012-12-22 | Discharge: 2012-12-22 | Disposition: A | Discharge status: Home / Self Care | Payer: BC Managed Care – PPO | Source: Ambulatory Visit | Attending: General Surgery | Admitting: General Surgery

## 2012-12-22 ENCOUNTER — Encounter (INDEPENDENT_AMBULATORY_CARE_PROVIDER_SITE_OTHER): Payer: Self-pay

## 2012-12-22 ENCOUNTER — Encounter (HOSPITAL_COMMUNITY): Payer: Self-pay

## 2012-12-22 ENCOUNTER — Encounter (HOSPITAL_COMMUNITY)
Admission: RE | Admit: 2012-12-22 | Discharge: 2012-12-22 | Disposition: A | Discharge status: Home / Self Care | Payer: BC Managed Care – PPO | Source: Ambulatory Visit | Attending: Anesthesiology | Admitting: Anesthesiology

## 2012-12-22 HISTORY — DX: Personal history of other diseases of the digestive system: Z87.19

## 2012-12-22 HISTORY — DX: Malignant (primary) neoplasm, unspecified: C80.1

## 2012-12-22 LAB — CBC
Hemoglobin: 15.3 g/dL — ABNORMAL HIGH (ref 12.0–15.0)
MCH: 29.6 pg (ref 26.0–34.0)
MCV: 84.3 fL (ref 78.0–100.0)
Platelets: 356 10*3/uL (ref 150–400)
RBC: 5.17 MIL/uL — ABNORMAL HIGH (ref 3.87–5.11)

## 2012-12-22 LAB — BASIC METABOLIC PANEL
BUN: 17 mg/dL (ref 6–23)
CO2: 30 mEq/L (ref 19–32)
Chloride: 101 mEq/L (ref 96–112)
GFR calc non Af Amer: 50 mL/min — ABNORMAL LOW (ref >90)
Glucose, Bld: 80 mg/dL (ref 70–99)
Potassium: 4.3 mEq/L (ref 3.5–5.1)
Sodium: 142 mEq/L (ref 135–145)

## 2012-12-22 NOTE — Pre-Procedure Instructions (Addendum)
Shannon Lindsey  12/22/2012   Your procedure is scheduled on:  12/25/12  Report to [facility] short stay admitting at 930 AM.  Call this number if you have problems the morning of surgery: [phone]   Remember:   Do not eat food or drink liquids after midnight.   Take these medicines the morning of surgery with A SIP OF WATER: none*   Do not wear jewelry, make-up or nail polish.  Do not wear lotions, powders, or perfumes. You may wear deodorant.  Do not shave 48 hours prior to surgery. Men may shave face and neck.  Do not bring valuables to the hospital.  [facility] is not responsible                  for any belongings or valuables.               Contacts, dentures or bridgework may not be worn into surgery.  Leave suitcase in the car. After surgery it may be brought to your room.  For patients admitted to the hospital, discharge time is determined by your                treatment team.               Patients discharged the day of surgery will not be allowed to drive  home.  Name and phone number of your driver:   Special Instructions: Shower using CHG 2 nights before surgery and the night before surgery.  If you shower the day of surgery use CHG.  Use special wash - you have one bottle of CHG for all showers.  You should use approximately 1/3 of the bottle for each shower.   Please read over the following fact sheets that you were given: Pain Booklet, Coughing and Deep Breathing and Surgical Site Infection Prevention

## 2012-12-22 NOTE — Progress Notes (Signed)
req'd stress from 08 id done and any other test ,notes

## 2012-12-22 NOTE — Progress Notes (Signed)
Anesthesia Chart Review:  Patient is a 47 year old female scheduled for wide excision of SCC, right chest on 12/25/12 by Dr. Janee Morn.  History includes non-smoker, HTN, HSV1 and 2, hiatal hernia, tonsillectomy, tubal ligation, c-section.  She reported that she was post-menopausal. PCP is listed as Dr. John Giovanni.    EKG on 12/22/12 showed NSR, possible anterior infarct (age undetermined). Overall, I think her EKG appears similar to a prior EKG on 02/12/06 from [facility].  She saw Dr. Patty Sermons in 2008, but has not had any recent cardiac testing.  CXR on 12/22/12 showed no active cardiopulmonary disease.  Preoperative labs noted.  EKG appears stable.  No CV symptoms documented at her PAT or recent [facility] visit.  She has no known history of MI/CHF or DM.  If no acute changes then I would anticipate that she could proceed as planned.  Velna Ochs [facility] Short Stay Center/Anesthesiology Phone [phone] 12/22/2012 4:38 PM

## 2012-12-24 MED ORDER — CEFAZOLIN SODIUM-DEXTROSE 2-3 GM-% IV SOLR
2.0000 g | INTRAVENOUS | Status: AC
  Administered 2012-12-25: 2 g via INTRAVENOUS
  Filled 2012-12-24: qty 50

## 2012-12-25 ENCOUNTER — Encounter (HOSPITAL_COMMUNITY): Payer: Self-pay | Admitting: *Deleted

## 2012-12-25 ENCOUNTER — Ambulatory Visit (HOSPITAL_COMMUNITY)
Admission: RE | Admit: 2012-12-25 | Discharge: 2012-12-25 | Disposition: A | Discharge status: Home / Self Care | Payer: BC Managed Care – PPO | Source: Ambulatory Visit | Attending: General Surgery | Admitting: General Surgery

## 2012-12-25 ENCOUNTER — Encounter (HOSPITAL_COMMUNITY)
Admission: RE | Disposition: A | Discharge status: Home / Self Care | Payer: Self-pay | Source: Ambulatory Visit | Attending: General Surgery

## 2012-12-25 ENCOUNTER — Encounter (HOSPITAL_COMMUNITY): Payer: Self-pay | Admitting: Vascular Surgery

## 2012-12-25 ENCOUNTER — Ambulatory Visit (HOSPITAL_COMMUNITY): Payer: BC Managed Care – PPO | Admitting: Certified Registered"

## 2012-12-25 DIAGNOSIS — Z01818 Encounter for other preprocedural examination: Secondary | ICD-10-CM | POA: Insufficient documentation

## 2012-12-25 DIAGNOSIS — I10 Essential (primary) hypertension: Secondary | ICD-10-CM | POA: Insufficient documentation

## 2012-12-25 DIAGNOSIS — C44529 Squamous cell carcinoma of skin of other part of trunk: Secondary | ICD-10-CM | POA: Insufficient documentation

## 2012-12-25 DIAGNOSIS — C4492 Squamous cell carcinoma of skin, unspecified: Secondary | ICD-10-CM

## 2012-12-25 DIAGNOSIS — Z01810 Encounter for preprocedural cardiovascular examination: Secondary | ICD-10-CM | POA: Insufficient documentation

## 2012-12-25 DIAGNOSIS — Z01812 Encounter for preprocedural laboratory examination: Secondary | ICD-10-CM | POA: Insufficient documentation

## 2012-12-25 HISTORY — PX: MELANOMA EXCISION: SHX5266

## 2012-12-25 SURGERY — EXCISION, MELANOMA
Anesthesia: General | Site: Chest | Laterality: Right | Wound class: Dirty or Infected

## 2012-12-25 MED ORDER — LIDOCAINE HCL (CARDIAC) 20 MG/ML IV SOLN
INTRAVENOUS | Status: DC | PRN
  Administered 2012-12-25: 50 mg via INTRAVENOUS

## 2012-12-25 MED ORDER — BUPIVACAINE-EPINEPHRINE 0.25% -1:200000 IJ SOLN
INTRAMUSCULAR | Status: DC | PRN
  Administered 2012-12-25: 10 mL

## 2012-12-25 MED ORDER — FENTANYL CITRATE 0.05 MG/ML IJ SOLN
INTRAMUSCULAR | Status: DC | PRN
  Administered 2012-12-25: 100 ug via INTRAVENOUS

## 2012-12-25 MED ORDER — OXYCODONE HCL 5 MG PO TABS: 5.0000 mg | ORAL_TABLET | Freq: Four times a day (QID) | ORAL | Status: DC | PRN

## 2012-12-25 MED ORDER — 0.9 % SODIUM CHLORIDE (POUR BTL) OPTIME
TOPICAL | Status: DC | PRN
  Administered 2012-12-25: 1000 mL

## 2012-12-25 MED ORDER — PROPOFOL 10 MG/ML IV BOLUS
INTRAVENOUS | Status: DC | PRN
  Administered 2012-12-25: 200 mg via INTRAVENOUS

## 2012-12-25 MED ORDER — BUPIVACAINE-EPINEPHRINE PF 0.25-1:200000 % IJ SOLN
INTRAMUSCULAR | Status: AC
  Filled 2012-12-25: qty 30

## 2012-12-25 MED ORDER — ONDANSETRON HCL 4 MG/2ML IJ SOLN
INTRAMUSCULAR | Status: DC | PRN
  Administered 2012-12-25: 4 mg via INTRAVENOUS

## 2012-12-25 MED ORDER — LACTATED RINGERS IV SOLN
INTRAVENOUS | Status: DC
  Administered 2012-12-25: 10:00:00 via INTRAVENOUS

## 2012-12-25 MED ORDER — CHLORHEXIDINE GLUCONATE 4 % EX LIQD: 1.0000 "application " | Freq: Once | CUTANEOUS | Status: DC

## 2012-12-25 MED ORDER — MIDAZOLAM HCL 2 MG/2ML IJ SOLN
INTRAMUSCULAR | Status: AC
  Filled 2012-12-25: qty 2

## 2012-12-25 MED ORDER — MIDAZOLAM HCL 5 MG/5ML IJ SOLN
INTRAMUSCULAR | Status: DC | PRN
  Administered 2012-12-25: 2 mg via INTRAVENOUS

## 2012-12-25 SURGICAL SUPPLY — 59 items
APL SKNCLS STERI-STRIP NONHPOA (GAUZE/BANDAGES/DRESSINGS)
BANDAGE GAUZE ELAST BULKY 4 IN (GAUZE/BANDAGES/DRESSINGS) IMPLANT
BENZOIN TINCTURE PRP APPL 2/3 (GAUZE/BANDAGES/DRESSINGS) IMPLANT
BLADE SURG 10 STRL SS (BLADE) ×2 IMPLANT
BLADE SURG 15 STRL LF DISP TIS (BLADE) ×1 IMPLANT
BLADE SURG 15 STRL SS (BLADE) ×2
BLADE SURG ROTATE 9660 (MISCELLANEOUS) IMPLANT
CANISTER SUCTION 2500CC (MISCELLANEOUS) ×1 IMPLANT
CHLORAPREP W/TINT 26ML (MISCELLANEOUS) ×2 IMPLANT
CLOTH BEACON ORANGE TIMEOUT ST (SAFETY) ×2 IMPLANT
COVER SURGICAL LIGHT HANDLE (MISCELLANEOUS) ×2 IMPLANT
DECANTER SPIKE VIAL GLASS SM (MISCELLANEOUS) ×2 IMPLANT
DRAPE LAPAROTOMY T 102X78X121 (DRAPES) IMPLANT
DRAPE ORTHO SPLIT 77X108 STRL (DRAPES)
DRAPE PED LAPAROTOMY (DRAPES) ×1 IMPLANT
DRAPE SURG ORHT 6 SPLT 77X108 (DRAPES) IMPLANT
DRAPE UTILITY 15X26 W/TAPE STR (DRAPE) ×4 IMPLANT
ELECT CAUTERY BLADE 6.4 (BLADE) ×2 IMPLANT
ELECT REM PT RETURN 9FT ADLT (ELECTROSURGICAL) ×2
ELECTRODE REM PT RTRN 9FT ADLT (ELECTROSURGICAL) ×1 IMPLANT
GLOVE BIO SURGEON STRL SZ7.5 (GLOVE) ×1 IMPLANT
GLOVE BIO SURGEON STRL SZ8 (GLOVE) ×3 IMPLANT
GLOVE BIOGEL PI IND STRL 7.0 (GLOVE) IMPLANT
GLOVE BIOGEL PI IND STRL 7.5 (GLOVE) IMPLANT
GLOVE BIOGEL PI IND STRL 8 (GLOVE) ×1 IMPLANT
GLOVE BIOGEL PI INDICATOR 7.0 (GLOVE) ×1
GLOVE BIOGEL PI INDICATOR 7.5 (GLOVE) ×1
GLOVE BIOGEL PI INDICATOR 8 (GLOVE) ×1
GOWN STRL NON-REIN LRG LVL3 (GOWN DISPOSABLE) ×3 IMPLANT
GOWN STRL REIN XL XLG (GOWN DISPOSABLE) ×2 IMPLANT
KIT BASIN OR (CUSTOM PROCEDURE TRAY) ×2 IMPLANT
KIT ROOM TURNOVER OR (KITS) ×2 IMPLANT
NEEDLE 22X1 1/2 (OR ONLY) (NEEDLE) ×2 IMPLANT
NS IRRIG 1000ML POUR BTL (IV SOLUTION) ×2 IMPLANT
PACK SURGICAL SETUP 50X90 (CUSTOM PROCEDURE TRAY) ×2 IMPLANT
PAD ARMBOARD 7.5X6 YLW CONV (MISCELLANEOUS) ×2 IMPLANT
PENCIL BUTTON HOLSTER BLD 10FT (ELECTRODE) ×2 IMPLANT
SPECIMEN JAR SMALL (MISCELLANEOUS) ×1 IMPLANT
SPONGE GAUZE 4X4 12PLY (GAUZE/BANDAGES/DRESSINGS) ×1 IMPLANT
SPONGE LAP 18X18 X RAY DECT (DISPOSABLE) ×2 IMPLANT
STAPLER VISISTAT 35W (STAPLE) IMPLANT
STOCKINETTE IMPERVIOUS LG (DRAPES) IMPLANT
STRIP CLOSURE SKIN 1/2X4 (GAUZE/BANDAGES/DRESSINGS) IMPLANT
SUT ETHILON 3 0 FSL (SUTURE) IMPLANT
SUT ETHILON 4 0 PS 2 18 (SUTURE) ×2 IMPLANT
SUT MON AB 4-0 PC3 18 (SUTURE) IMPLANT
SUT SILK 2 0 SH (SUTURE) ×1 IMPLANT
SUT VIC AB 2-0 SH 27 (SUTURE)
SUT VIC AB 2-0 SH 27X BRD (SUTURE) IMPLANT
SUT VIC AB 3-0 SH 27 (SUTURE)
SUT VIC AB 3-0 SH 27XBRD (SUTURE) IMPLANT
SYR BULB 3OZ (MISCELLANEOUS) ×2 IMPLANT
SYR CONTROL 10ML LL (SYRINGE) ×2 IMPLANT
TAPE CLOTH SURG 6X10 WHT LF (GAUZE/BANDAGES/DRESSINGS) ×1 IMPLANT
TOWEL OR 17X24 6PK STRL BLUE (TOWEL DISPOSABLE) ×2 IMPLANT
TOWEL OR 17X26 10 PK STRL BLUE (TOWEL DISPOSABLE) ×2 IMPLANT
TUBE CONNECTING 12X1/4 (SUCTIONS) IMPLANT
UNDERPAD 30X30 INCONTINENT (UNDERPADS AND DIAPERS) ×2 IMPLANT
YANKAUER SUCT BULB TIP NO VENT (SUCTIONS) IMPLANT

## 2012-12-25 NOTE — Transfer of Care (Signed)
Immediate Anesthesia Transfer of Care Note  Patient: Shannon Lindsey  Procedure(s) Performed: Procedure(s): wide EXCISION squamous cell carcinoma right chest  (Right)  Patient Location: PACU  Anesthesia Type:General  Level of Consciousness: responds to stimulation  Airway & Oxygen Therapy: Patient Spontanous Breathing  Post-op Assessment: Report given to PACU RN and Post -op Vital signs reviewed and stable  Post vital signs: Reviewed and stable  Complications: No apparent anesthesia complications

## 2012-12-25 NOTE — Op Note (Signed)
12/25/2012  11:04 AM  PATIENT:  Shannon Lindsey  47 y.o. female  PRE-OPERATIVE DIAGNOSIS:  squamous cell carcinoma right chest   POST-OPERATIVE DIAGNOSIS:  squamous cell carcinoma right chest  PROCEDURE:  Procedure(s): wide excision squamous cell carcinoma right chest 6cm x 2.5cm with layered closure  SURGEON:  Surgeon(s): Liz Malady, MD  PHYSICIAN ASSISTANT:   ASSISTANTS: none   ANESTHESIA:   local and general  EBL:     BLOOD ADMINISTERED:none  DRAINS: none   SPECIMEN:  Excision  DISPOSITION OF SPECIMEN:  PATHOLOGY  COUNTS:  YES  DICTATION: .Dragon Dictation Patient presents for wide excision of squamous cell carcinoma site on her right chest. She was identified in the preop holding area. Her site was marked. She received intravenous antibiotics. She was brought to the operating room and general anesthesia with laryngeal mask airway was administered by the anesthesia staff. Her chest was prepped and draped in a sterile fashion. Time out procedure was done. An area of 0.5cm margin was circumferentially measured around the biopsy site. An elliptical incision was made tying the way to go along tissue lines. Local anesthetic was injected.Elliptical incision was made encompassing the margin. Subcutaneous tissues were dissected down and the ellipse was excised with the underlying fat. Cautery was used to get good hemostasis. The specimen was marked for pathology with sutures. I changed my gloves and did not reuse instruments. Small flaps were raised superiorly and inferiorly. Subcutaneous tissues were closed with interrupted 3-0 Vicryl sutures. Skin was closed with interrupted 4-0 nylon sutures. Wound came together without tension. All counts were correct. A sterile dressing was applied. Patient tolerated the procedure well without apparent complication and was taken recovery in stable condition.  PATIENT DISPOSITION:  PACU - hemodynamically stable.   Delay start of  Pharmacological VTE agent (>24hrs) due to surgical blood loss or risk of bleeding:  no  Violeta Gelinas, MD, MPH, FACS Pager: [phone]  9/29/201411:04 AM

## 2012-12-25 NOTE — Interval H&P Note (Signed)
History and Physical Interval Note:  12/25/2012 10:23 AM  Shannon Lindsey  has presented today for surgery, with the diagnosis of squamous cell carcinoma right chest   The various methods of treatment have been discussed with the patient and family. After consideration of risks, benefits and other options for treatment, the patient has consented to  Procedure(s): wide EXCISION squamous cell carcinoma right chest  (Right) as a surgical intervention .  The patient's history has been reviewed, patient re-examined and her site was marked, no change in status, stable for surgery.  I have reviewed the patient's chart and labs.  Questions were answered to the patient's satisfaction.     [name]

## 2012-12-25 NOTE — Anesthesia Postprocedure Evaluation (Signed)
Anesthesia Post Note  Patient: Shannon Lindsey  Procedure(s) Performed: Procedure(s) (LRB): wide EXCISION squamous cell carcinoma right chest  (Right)  Anesthesia type: General  Patient location: PACU  Post pain: Pain level controlled and Adequate analgesia  Post assessment: Post-op Vital signs reviewed, Patient's Cardiovascular Status Stable, Respiratory Function Stable, Patent Airway and Pain level controlled  Last Vitals:  Filed Vitals:   12/25/12 1108  BP: 108/56  Pulse: 75  Temp: 35.7 C  Resp: 13    Post vital signs: Reviewed and stable  Level of consciousness: awake, alert  and oriented  Complications: No apparent anesthesia complications

## 2012-12-25 NOTE — H&P (View-Only) (Signed)
Patient ID: Shannon Lindsey, female   DOB: 12/16/1965, 47 y.o.   MRN: [id number]  Chief Complaint  Patient presents with  . New Evaluation    eval carcinoma on the rt chest    HPI Shannon Lindsey is a 47 y.o. female.  Chief complaint: Squamous cell carcinoma right chest HPI The patient noticed a small blister on her right upper chest several weeks ago. It was itchy and occasionally bled. It gradually got larger. It persisted for 6 weeks so she was evaluated by Dr. Hall from dermatology. Shave biopsy was done which revealed moderately differentiated squamous cell carcinoma. Margins were free. I was asked to see her in consultation for consideration of reexcision. She is not having any difficulties with wound healing. She is keeping Neosporin on it and usually keeping it covered.  Past Medical History  Diagnosis Date  . HSV-2 (herpes simplex virus 2) infection   . Hypertension   . HSV-1 (herpes simplex virus 1) infection   . Abnormal Pap smear   . Warts, genital     Past Surgical History  Procedure Laterality Date  . Cesarean section    . Tonsillectomy    . Exploratory laparotomy    . Colposcopy w/ biopsy / curettage    . Tubal ligation    . Mole removal      has had 30 + removed not cancer    Family History  Problem Relation Age of Onset  . Cancer Father     colon  . Diabetes Other   . Atrial fibrillation Mother   . Stroke Sister     Social History History  Substance Use Topics  . Smoking status: Never Smoker   . Smokeless tobacco: Never Used  . Alcohol Use: Yes     Comment: occ.    Allergies  Allergen Reactions  . Erythromycin Other (See Comments)    Gets a UTI every time.    Current Outpatient Prescriptions  Medication Sig Dispense Refill  . lisinopril-hydrochlorothiazide (PRINZIDE,ZESTORETIC) 20-12.5 MG per tablet Take 1 tablet by mouth daily.      . valACYclovir (VALTREX) 500 MG tablet Take 500 mg by mouth as needed.       No current facility-administered  medications for this visit.    Review of Systems Review of Systems  Constitutional: Negative for fever, chills and unexpected weight change.  HENT: Negative for hearing loss, congestion, sore throat, trouble swallowing and voice change.   Eyes: Negative for visual disturbance.  Respiratory: Negative for cough and wheezing.   Cardiovascular: Negative for chest pain, palpitations and leg swelling.  Gastrointestinal: Negative for nausea, vomiting, abdominal pain, diarrhea, constipation, blood in stool, abdominal distention and anal bleeding.  Genitourinary: Negative for hematuria, vaginal bleeding and difficulty urinating.  Musculoskeletal: Negative for arthralgias.  Skin: Positive for wound. Negative for rash.       The history of present illness  Neurological: Negative for seizures, syncope and headaches.  Hematological: Negative for adenopathy. Does not bruise/bleed easily.  Psychiatric/Behavioral: Negative for confusion.    Blood pressure 118/66, pulse 80, resp. rate 14, height 5' 3" (1.6 m), weight 144 lb 3.2 oz (65.409 kg).  Physical Exam Physical Exam  Constitutional: She is oriented to person, place, and time. She appears well-developed and well-nourished. No distress.  HENT:  Head: Normocephalic and atraumatic.  Nose: Nose normal.  Mouth/Throat: No oropharyngeal exudate.  Eyes: EOM are normal. Pupils are equal, round, and reactive to light. No scleral icterus.  Neck: Normal range   of motion. Neck supple. No tracheal deviation present.  Cardiovascular: Normal rate, regular rhythm, normal heart sounds and intact distal pulses.   Pulmonary/Chest: Effort normal and breath sounds normal. No stridor. No respiratory distress. She has no wheezes. She has no rales.    Biopsy site right upper chest, no evidence of infection  Abdominal: Soft. She exhibits no distension. There is no tenderness. There is no rebound.  Musculoskeletal: She exhibits no edema and no tenderness.    Neurological: She is alert and oriented to person, place, and time. She exhibits normal muscle tone. Coordination normal.  Lymphatic exam: No palpable bilateral axillary, bilateral cervical,  Or supraclavicular lymphadenopathy  Data Reviewed Pathology report as above  Assessment    Squamous cell carcinoma right upper chest, status post shave biopsy    Plan    I have recommended wide reexcision with 0.5 cm margins to give her the best chance of cure. Procedure, risks, and benefits were discussed in detail with the patient. She is agreeable.       Enis Leatherwood E 12/15/2012, 2:30 PM    

## 2012-12-25 NOTE — Anesthesia Preprocedure Evaluation (Signed)
Anesthesia Evaluation  Patient identified by MRN, date of birth, ID band Patient awake    Reviewed: Allergy & Precautions, H&P , NPO status   Airway Mallampati: I TM Distance: >3 FB Neck ROM: Full    Dental  (+) Teeth Intact   Pulmonary          Cardiovascular hypertension, Pt. on medications Rhythm:Regular Rate:Normal     Neuro/Psych    GI/Hepatic hiatal hernia,   Endo/Other    Renal/GU      Musculoskeletal   Abdominal   Peds  Hematology   Anesthesia Other Findings   Reproductive/Obstetrics                           Anesthesia Physical Anesthesia Plan  ASA: II  Anesthesia Plan: General   Post-op Pain Management:    Induction: Intravenous  Airway Management Planned: LMA  Additional Equipment:   Intra-op Plan:   Post-operative Plan: Extubation in OR  Informed Consent: I have reviewed the patients History and Physical, chart, labs and discussed the procedure including the risks, benefits and alternatives for the proposed anesthesia with the patient or authorized representative who has indicated his/her understanding and acceptance.   Dental advisory given  Plan Discussed with: Anesthesiologist and Surgeon  Anesthesia Plan Comments:         Anesthesia Quick Evaluation

## 2012-12-26 ENCOUNTER — Encounter (HOSPITAL_COMMUNITY): Payer: Self-pay | Admitting: General Surgery

## 2012-12-26 ENCOUNTER — Telehealth: Payer: Self-pay | Admitting: General Surgery

## 2012-12-26 ENCOUNTER — Telehealth (INDEPENDENT_AMBULATORY_CARE_PROVIDER_SITE_OTHER): Payer: Self-pay | Admitting: *Deleted

## 2012-12-26 NOTE — Telephone Encounter (Signed)
I called pt to inform her of her po appt with Dr. Janee Morn on 01/08/13 with an arrival time of 3:35.

## 2012-12-26 NOTE — Telephone Encounter (Signed)
Left MOM regarding pathology

## 2013-01-08 ENCOUNTER — Encounter (INDEPENDENT_AMBULATORY_CARE_PROVIDER_SITE_OTHER): Payer: Self-pay | Admitting: General Surgery

## 2013-01-08 ENCOUNTER — Ambulatory Visit (INDEPENDENT_AMBULATORY_CARE_PROVIDER_SITE_OTHER): Payer: BC Managed Care – PPO | Admitting: General Surgery

## 2013-01-08 VITALS — BP 118/68 | HR 96 | Temp 98.7°F | Resp 14 | Ht 63.0 in | Wt 146.8 lb

## 2013-01-08 DIAGNOSIS — C4492 Squamous cell carcinoma of skin, unspecified: Secondary | ICD-10-CM

## 2013-01-08 NOTE — Progress Notes (Signed)
Subjective:     Patient ID: Shannon Lindsey, female   DOB: 1965-05-11, 47 y.o.   MRN: [id number]  HPI  Status post wide excision squamous cell carcinoma right chest. She has some pulling sensation her incision but otherwise is doing well. Review of Systems     Objective:   Physical Exam Incision is healing nicely. Sutures were removed and Steri-Strips were placed. No signs of infection.    Assessment:      Doing well Status post wide excision squamous cell carcinoma right chest    Plan:     Local wound care instructions given. Return when necessary.

## 2013-03-14 ENCOUNTER — Other Ambulatory Visit: Payer: BC Managed Care – PPO | Admitting: Adult Health

## 2013-04-05 ENCOUNTER — Other Ambulatory Visit: Payer: Self-pay | Admitting: Adult Health

## 2013-06-14 ENCOUNTER — Other Ambulatory Visit: Payer: Self-pay | Admitting: Obstetrics and Gynecology

## 2013-06-14 DIAGNOSIS — Z1231 Encounter for screening mammogram for malignant neoplasm of breast: Secondary | ICD-10-CM

## 2013-06-18 ENCOUNTER — Ambulatory Visit (HOSPITAL_COMMUNITY): Payer: BC Managed Care – PPO

## 2013-07-11 ENCOUNTER — Ambulatory Visit (INDEPENDENT_AMBULATORY_CARE_PROVIDER_SITE_OTHER): Payer: BC Managed Care – PPO | Admitting: Adult Health

## 2013-07-11 ENCOUNTER — Encounter: Payer: Self-pay | Admitting: Adult Health

## 2013-07-11 VITALS — BP 118/70 | HR 72 | Ht 63.0 in | Wt 150.0 lb

## 2013-07-11 DIAGNOSIS — A630 Anogenital (venereal) warts: Secondary | ICD-10-CM

## 2013-07-11 DIAGNOSIS — Z01419 Encounter for gynecological examination (general) (routine) without abnormal findings: Secondary | ICD-10-CM

## 2013-07-11 DIAGNOSIS — Z1212 Encounter for screening for malignant neoplasm of rectum: Secondary | ICD-10-CM

## 2013-07-11 LAB — HEMOCCULT GUIAC POC 1CARD (OFFICE): Fecal Occult Blood, POC: NEGATIVE

## 2013-07-11 MED ORDER — IMIQUIMOD 5 % EX CREA: TOPICAL_CREAM | CUTANEOUS | Status: DC

## 2013-07-11 NOTE — Progress Notes (Addendum)
Patient ID: Shannon Lindsey, female   DOB: 29-May-1965, 48 y.o.   MRN: [id number] History of Present Illness: Shannon Lindsey is a 48 year old white female, married in for physical, she had a normal pap 07/12/12 with negative HPV.   Current Medications, Allergies, Past Medical History, Past Surgical History, Family History and Social History were reviewed in [employer] record.     Review of Systems: Patient denies any headaches, blurred vision, shortness of breath, chest pain, abdominal pain, problems with bowel movements, urination, or intercourse. No joint pain or mood swings, says wart is back near rectum.    Physical Exam:BP 118/70  Pulse 72  Ht 5\' 3"  (1.6 m)  Wt 150 lb (68.04 kg)  BMI 26.58 kg/m2 General:  Well developed, well nourished, no acute distress Skin:  Warm and dry Neck:  Midline trachea, normal thyroid Lungs; Clear to auscultation bilaterally Breast:  No dominant palpable mass, retraction, or nipple discharge Cardiovascular: Regular rate and rhythm Abdomen:  Soft, non tender, no hepatosplenomegaly Pelvic:  External genitalia is normal in appearance.  The vagina is normal in appearance.  The cervix is bulbous and smooth.  Uterus is felt to be normal size, shape, and contour.  No adnexal masses or tenderness noted. Rectal: Good sphincter tone, no polyps, or hemorrhoids felt.  Hemoccult negative.Has 2 small warts at 12 o'clock. Extremities:  No swelling or varicosities noted Psych:  No mood changes, alert and cooperative, seems happy   Impression: Yearly gyn exam no pap Genital warts     Plan: Physical in 1 year Mammogram yearly Colonoscopy at 2016 Rx Aldara use 3 x weekly to warts Follow up in 4 weeks to check warts and review handout on warts, pt aware may take time

## 2013-07-11 NOTE — Patient Instructions (Signed)
Warts Warts are a common viral infection. They are most commonly caused by the human papillomavirus (HPV). Warts can occur at all ages. However, they occur most frequently in older children and infrequently in the elderly. Warts may be single or multiple. Location and size varies. Warts can be spread by scratching the wart and then scratching normal skin. The life cycle of warts varies. However, most will disappear over many months to a couple years. Warts commonly do not cause problems (asymptomatic) unless they are over an area of pressure, such as the bottom of the foot. If they are large enough, they may cause pain with walking. DIAGNOSIS  Warts are most commonly diagnosed by their appearance. Tissue samples (biopsies) are not required unless the wart looks abnormal. Most warts have a rough surface, are round, oval, or irregular, and are skin-colored to light yellow, brown, or gray. They are generally less than  inch (1.3 cm), but they can be any size. TREATMENT   Observation or no treatment.  Freezing with liquid nitrogen.  High heat (cautery).  Boosting the body's immunity to fight off the wart (immunotherapy using Candida antigen).  Laser surgery.  Application of various irritants and solutions. HOME CARE INSTRUCTIONS  Follow your caregiver's instructions. No special precautions are necessary. Often, treatment may be followed by a return (recurrence) of warts. Warts are generally difficult to treat and get rid of. If treatment is done in a clinic setting, usually more than 1 treatment is required. This is usually done on only a monthly basis until the wart is completely gone. SEEK IMMEDIATE MEDICAL CARE IF: The treated skin becomes red, puffy (swollen), or painful. Document Released: 12/23/2004 Document Revised: 07/10/2012 Document Reviewed: 06/20/2009 [facility] Patient Information 2014 [place]. Follow up in 4 weeksGenital Warts Genital warts are a sexually transmitted  infection. They may appear as small bumps on the tissues of the genital area. CAUSES  Genital warts are caused by a virus called human papillomavirus (HPV). HPV is the most common sexually transmitted disease (STD) and infection of the sex organs. This infection is spread by having unprotected sex with an infected person. It can be spread by vaginal, anal, and oral sex. Many people do not know they are infected. They may be infected for years without problems. However, even if they do not have problems, they can unknowingly pass the infection to their sexual partners. SYMPTOMS   Itching and irritation in the genital area.  Warts that bleed.  Painful sexual intercourse. DIAGNOSIS  Warts are usually recognized with the naked eye on the vagina, vulva, perineum, anus, and rectum. Certain tests can also diagnose genital warts, such as:  A Pap test.  A tissue sample (biopsy) exam.  Colposcopy. A magnifying tool is used to examine the vagina and cervix. The HPV cells will change color when certain solutions are used. TREATMENT  Warts can be removed by:  Applying certain chemicals, such as cantharidin or podophyllin.  Liquid nitrogen freezing (cryotherapy).  Immunotherapy with candida or trichophyton injections.  Laser treatment.  Burning with an electrified probe (electrocautery).  Interferon injections.  Surgery. PREVENTION  HPV vaccination can help prevent HPV infections that cause genital warts and that cause cancer of the cervix. It is recommended that the vaccination be given to people between the ages 26 to 49 years old. The vaccine might not work as well or might not work at all if you already have HPV. It should not be given to pregnant women. HOME CARE INSTRUCTIONS  It is important to follow your caregiver's instructions. The warts will not go away without treatment. Repeat treatments are often needed to get rid of warts. Even after it appears that the warts are gone, the  normal tissue underneath often remains infected.  Do not try to treat genital warts with medicine used to treat hand warts. This type of medicine is strong and can burn the skin in the genital area, causing more damage.  Tell your past and current sexual partner(s) that you have genital warts. They may be infected also and need treatment.  Avoid sexual contact while being treated.  Do not touch or scratch the warts. The infection may spread to other parts of your body.  Women with genital warts should have a cervical cancer check (Pap test) at least once a year. This type of cancer is slow-growing and can be cured if found early. Chances of developing cervical cancer are increased with HPV.  Inform your obstetrician about your warts in the event of pregnancy. This virus can be passed to the baby's respiratory tract. Discuss this with your caregiver.  Use a condom during sexual intercourse. Following treatment, the use of condoms will help prevent reinfection.  Ask your caregiver about using over-the-counter anti-itch creams. SEEK MEDICAL CARE IF:   Your treated skin becomes red, swollen, or painful.  You have a fever.  You feel generally ill.  You feel little lumps in and around your genital area.  You are bleeding or have painful sexual intercourse. MAKE SURE YOU:   Understand these instructions.  Will watch your condition.  Will get help right away if you are not doing well or get worse. Document Released: 03/12/2000 Document Revised: 06/07/2011 Document Reviewed: 09/21/2010 [facility] Patient Information 2014 [place], [place]. Physical in 1 year Mammogram yearly colonoscopy 2016

## 2013-08-03 ENCOUNTER — Telehealth: Payer: Self-pay | Admitting: Adult Health

## 2013-08-03 NOTE — Telephone Encounter (Signed)
Pt aware that no pap was done.

## 2013-08-08 ENCOUNTER — Ambulatory Visit: Payer: BC Managed Care – PPO | Admitting: Adult Health

## 2013-10-17 ENCOUNTER — Ambulatory Visit (HOSPITAL_COMMUNITY): Payer: BC Managed Care – PPO

## 2013-10-18 ENCOUNTER — Ambulatory Visit (HOSPITAL_COMMUNITY)
Admission: RE | Admit: 2013-10-18 | Discharge: 2013-10-18 | Disposition: A | Discharge status: Home / Self Care | Payer: BC Managed Care – PPO | Source: Ambulatory Visit | Attending: Obstetrics and Gynecology | Admitting: Obstetrics and Gynecology

## 2013-10-18 DIAGNOSIS — Z1231 Encounter for screening mammogram for malignant neoplasm of breast: Secondary | ICD-10-CM

## 2014-01-28 ENCOUNTER — Encounter: Payer: Self-pay | Admitting: Adult Health

## 2014-02-07 ENCOUNTER — Telehealth: Payer: Self-pay | Admitting: Adult Health

## 2014-02-07 NOTE — Telephone Encounter (Signed)
Has used aldara in past and says warts did not go away,so stopeed but will resume using 3 x weekly and will check in about 3 months.

## 2014-05-03 ENCOUNTER — Other Ambulatory Visit: Payer: Self-pay | Admitting: Adult Health

## 2014-06-16 ENCOUNTER — Other Ambulatory Visit: Payer: Self-pay | Admitting: Adult Health

## 2014-07-10 ENCOUNTER — Telehealth: Payer: Self-pay | Admitting: *Deleted

## 2014-07-10 NOTE — Telephone Encounter (Signed)
Left message i called

## 2014-07-17 ENCOUNTER — Other Ambulatory Visit: Payer: Self-pay | Admitting: Adult Health

## 2014-07-17 ENCOUNTER — Ambulatory Visit (INDEPENDENT_AMBULATORY_CARE_PROVIDER_SITE_OTHER): Payer: BLUE CROSS/BLUE SHIELD | Admitting: Adult Health

## 2014-07-17 ENCOUNTER — Encounter: Payer: Self-pay | Admitting: Adult Health

## 2014-07-17 VITALS — BP 110/60 | HR 68 | Ht 63.0 in | Wt 150.0 lb

## 2014-07-17 DIAGNOSIS — Z01419 Encounter for gynecological examination (general) (routine) without abnormal findings: Secondary | ICD-10-CM

## 2014-07-17 DIAGNOSIS — Z139 Encounter for screening, unspecified: Secondary | ICD-10-CM

## 2014-07-17 DIAGNOSIS — A630 Anogenital (venereal) warts: Secondary | ICD-10-CM

## 2014-07-17 DIAGNOSIS — I10 Essential (primary) hypertension: Secondary | ICD-10-CM

## 2014-07-17 DIAGNOSIS — Z1212 Encounter for screening for malignant neoplasm of rectum: Secondary | ICD-10-CM | POA: Diagnosis not present

## 2014-07-17 DIAGNOSIS — K30 Functional dyspepsia: Secondary | ICD-10-CM | POA: Insufficient documentation

## 2014-07-17 DIAGNOSIS — N898 Other specified noninflammatory disorders of vagina: Secondary | ICD-10-CM

## 2014-07-17 HISTORY — DX: Other specified noninflammatory disorders of vagina: N89.8

## 2014-07-17 LAB — HEMOCCULT GUIAC POC 1CARD (OFFICE): Fecal Occult Blood, POC: NEGATIVE

## 2014-07-17 MED ORDER — OMEPRAZOLE 20 MG PO CPDR: 20.0000 mg | DELAYED_RELEASE_CAPSULE | Freq: Every day | ORAL | Status: DC

## 2014-07-17 MED ORDER — LISINOPRIL-HYDROCHLOROTHIAZIDE 20-12.5 MG PO TABS: 1.0000 | ORAL_TABLET | Freq: Every day | ORAL | Status: DC

## 2014-07-17 NOTE — Patient Instructions (Signed)
Get mammogram Colonoscopy advised Pap and physical in 1 year Return in 1 week for wart removal Try luvena

## 2014-07-17 NOTE — Progress Notes (Signed)
Patient ID: Shannon Lindsey, female   DOB: 1965-07-28, 49 y.o.   MRN: [id number] History of Present Illness: Shannon Lindsey is a 49 year old white female, separated in for well woman gyn exam.Had normal pap with negative HPV 07/12/12.Had skin cancer removed right leg recently.   Current Medications, Allergies, Past Medical History, Past Surgical History, Family History and Social History were reviewed in [employer] record.     Review of Systems: Patient denies any headaches, hearing loss, fatigue, blurred vision, shortness of breath, chest pain, abdominal pain, problems with bowel movements, urination. No joint pain or mood swings.Has vaginal dryness and sex hurts, has some indigestion at times and food hurts when passing down at times. Complains about belly.   Physical Exam:BP 110/60 mmHg  Pulse 68  Ht 5\' 3"  (1.6 m)  Wt 150 lb (68.04 kg)  BMI 26.58 kg/m2 General:  Well developed, well nourished, no acute distress Skin:  Warm and dry Neck:  Midline trachea, normal thyroid, good ROM, no lymphadenopathy Lungs; Clear to auscultation bilaterally Breast:  No dominant palpable mass, retraction, or nipple discharge Cardiovascular: Regular rate and rhythm Abdomen:  Soft, non tender, no hepatosplenomegaly Pelvic:  External genitalia is normal in appearance, no lesions.  The vagina has decreased color, moisture and rugae. Urethra has no lesions or masses. The cervix is smooth.  Uterus is felt to be normal size, shape, and contour.  No adnexal masses or tenderness noted.Bladder is non tender, no masses felt. Rectal: Good sphincter tone, no polyps, or hemorrhoids felt.  Hemoccult negative.Has 2 warts at anus Extremities/musculoskeletal:  No swelling or varicosities noted, no clubbing or cyanosis Psych:  No mood changes, alert and cooperative,seems happy Discussed trying luvena and increased foreplay.  Impression: Well woman gyn exam no pap Warts Hypertension Vaginal  dryness Indigestion    Plan: Referred to Dr Oneida Alar for colonoscopy and ?EGD Rx prilosec 20 mg 1 daily #30 with 6 refills Try luvena Pap and physical in 1 year Return in 1 week for wart removal with Dr Glo Herring Check CBC,CMP,TSH and lipids

## 2014-07-18 ENCOUNTER — Encounter: Payer: Self-pay | Admitting: Gastroenterology

## 2014-07-18 LAB — COMPREHENSIVE METABOLIC PANEL
A/G RATIO: 2 (ref 1.1–2.5)
ALK PHOS: 71 IU/L (ref 39–117)
ALT: 10 IU/L (ref 0–32)
AST: 9 IU/L (ref 0–40)
Albumin: 4.4 g/dL (ref 3.5–5.5)
BUN/Creatinine Ratio: 11 (ref 9–23)
BUN: 10 mg/dL (ref 6–24)
Bilirubin Total: 0.3 mg/dL (ref 0.0–1.2)
CALCIUM: 9.4 mg/dL (ref 8.7–10.2)
CO2: 25 mmol/L (ref 18–29)
Chloride: 100 mmol/L (ref 97–108)
Creatinine, Ser: 0.88 mg/dL (ref 0.57–1.00)
GFR calc non Af Amer: 76 mL/min/{1.73_m2} (ref >59)
GFR, EST AFRICAN AMERICAN: 88 mL/min/{1.73_m2} (ref >59)
GLOBULIN, TOTAL: 2.2 g/dL (ref 1.5–4.5)
Glucose: 89 mg/dL (ref 65–99)
POTASSIUM: 4.7 mmol/L (ref 3.5–5.2)
Sodium: 140 mmol/L (ref 134–144)
Total Protein: 6.6 g/dL (ref 6.0–8.5)

## 2014-07-18 LAB — LIPID PANEL
CHOLESTEROL TOTAL: 205 mg/dL — AB (ref 100–199)
Chol/HDL Ratio: 4.7 ratio units — ABNORMAL HIGH (ref 0.0–4.4)
HDL: 44 mg/dL (ref >39)
LDL CALC: 137 mg/dL — AB (ref 0–99)
TRIGLYCERIDES: 120 mg/dL (ref 0–149)
VLDL Cholesterol Cal: 24 mg/dL (ref 5–40)

## 2014-07-18 LAB — CBC
HCT: 39.8 % (ref 34.0–46.6)
HEMOGLOBIN: 13.4 g/dL (ref 11.1–15.9)
MCH: 28.6 pg (ref 26.6–33.0)
MCHC: 33.7 g/dL (ref 31.5–35.7)
MCV: 85 fL (ref 79–97)
Platelets: 283 10*3/uL (ref 150–379)
RBC: 4.69 x10E6/uL (ref 3.77–5.28)
RDW: 13.8 % (ref 12.3–15.4)
WBC: 5.3 10*3/uL (ref 3.4–10.8)

## 2014-07-18 LAB — TSH: TSH: 0.94 u[IU]/mL (ref 0.450–4.500)

## 2014-07-24 ENCOUNTER — Encounter: Payer: Self-pay | Admitting: Obstetrics and Gynecology

## 2014-07-24 ENCOUNTER — Ambulatory Visit (INDEPENDENT_AMBULATORY_CARE_PROVIDER_SITE_OTHER): Payer: BLUE CROSS/BLUE SHIELD | Admitting: Obstetrics and Gynecology

## 2014-07-24 ENCOUNTER — Telehealth: Payer: Self-pay | Admitting: Adult Health

## 2014-07-24 VITALS — BP 110/66 | Ht 63.0 in | Wt 150.5 lb

## 2014-07-24 DIAGNOSIS — A630 Anogenital (venereal) warts: Secondary | ICD-10-CM

## 2014-07-24 NOTE — Progress Notes (Signed)
Patient ID: [name], female   DOB: 03/05/1966, 49 y.o.   MRN: [id number]   Pt here today for wart removal. Pt states that the wart is near the rectum.

## 2014-07-24 NOTE — Progress Notes (Signed)
Patient ID: Shannon Lindsey, female   DOB: 1965/05/23, 49 y.o.   MRN: [id number]   [facility] Visit  Patient name: Shannon Lindsey MRN [id number]  Date of birth: [birth year]  CC & HPI:  Shannon Lindsey is a 49 y.o. female presenting today for a wart removed from near her rectum. Patient did not notice her wart until Anderson Malta saw it. Patient denies any sexual activity in the past 2-3 years, as she has divorced with her husband. Patient's mother had colon cancer at 72, so she has been getting colonoscopies.    ROS:  A complete 10 system review of systems was obtained and all systems are negative except as noted in the HPI and PMH.    Pertinent History Reviewed:   Reviewed: Significant for Cesarean section and tubal ligation Medical         Past Medical History  Diagnosis Date  . HSV-2 (herpes simplex virus 2) infection   . Hypertension   . HSV-1 (herpes simplex virus 1) infection   . Abnormal Pap smear   . Warts, genital   . H/O hiatal hernia   . Cancer     squamous cell chest  . Vaginal Pap smear, abnormal   . Vaginal dryness 07/17/2014                              Surgical Hx:    Past Surgical History  Procedure Laterality Date  . Cesarean section    . Tonsillectomy    . Exploratory laparotomy    . Colposcopy w/ biopsy / curettage    . Tubal ligation    . Mole removal      has had 30 + removed not cancer  . Melanoma excision Right 12/25/2012    Procedure: wide EXCISION squamous cell carcinoma right chest ;  Surgeon: Zenovia Jarred, MD;  Location: [facility];  Service: General;  Laterality: Right;   Medications: Reviewed & Updated - see associated section                       Current outpatient prescriptions:  .  lisinopril-hydrochlorothiazide (PRINZIDE,ZESTORETIC) 20-12.5 MG per tablet, Take 1 tablet by mouth daily., Disp: 90 tablet, Rfl: 3 .  valACYclovir (VALTREX) 1000 MG tablet, TAKE ONE TABLET BY MOUTH ONCE DAILY, Disp: 30 tablet, Rfl: 6 .  imiquimod (ALDARA) 5 %  cream, Apply topically 3 (three) times a week. (Patient not taking: Reported on 07/17/2014), Disp: 12 each, Rfl: 1 .  omeprazole (PRILOSEC) 20 MG capsule, Take 1 capsule (20 mg total) by mouth daily. (Patient not taking: Reported on 07/24/2014), Disp: 30 capsule, Rfl: 6   Social History: Reviewed -  reports that she has never smoked. She has never used smokeless tobacco.  Objective Findings:  Vitals: Blood pressure 110/66, height 5\' 3"  (1.6 m), weight 150 lb 8 oz (68.266 kg).  Physical Examination: Mental status - alert, oriented to person, place, and time, normal mood, behavior, speech, dress, motor activity, and thought processes Physical Examination: Pelvic - normal external genitalia, vulva, vagina, cervix, uterus and adnexa, VULVA: , RECTAL: normal rectal, no masses, warts noted at 11, 1 and removed under local anesthesia, after topical anesthetic,    Assessment & Plan:   A:  1. Perianal condyloma   P:  1. Warts removed and postop care reviewed.  This chart was SCRIBED for Mallory Shirk, MD by Stephania Fragmin, ED  Scribe. This patient was seen in room 2 and the patient's care was started at 3:01 PM.  I personally performed the services described in this documentation, which was SCRIBED in my presence. The recorded information has been reviewed and considered accurate. It has been edited as necessary during review. Jonnie Kind, MD

## 2014-07-24 NOTE — Telephone Encounter (Signed)
Pt aware of labs and need to modify diet and exercise

## 2014-07-29 ENCOUNTER — Other Ambulatory Visit: Payer: Self-pay | Admitting: Obstetrics and Gynecology

## 2014-07-29 DIAGNOSIS — Z1231 Encounter for screening mammogram for malignant neoplasm of breast: Secondary | ICD-10-CM

## 2014-08-14 ENCOUNTER — Ambulatory Visit: Payer: Self-pay | Admitting: Gastroenterology

## 2014-09-06 ENCOUNTER — Telehealth: Payer: Self-pay | Admitting: Adult Health

## 2014-09-06 NOTE — Telephone Encounter (Signed)
Pt c/o joint pain, ankles, wrist, hip, bottom of feet. Pt states is there blood work that can be done for inflammation? Does she need to be seen? Pt informed Derrek Monaco, NP out of office today, back on Monday, June 13,2016. Will route message. Pt verbalized understanding.

## 2014-09-08 ENCOUNTER — Emergency Department (HOSPITAL_COMMUNITY)
Admission: EM | Admit: 2014-09-08 | Discharge: 2014-09-08 | Disposition: A | Discharge status: Home / Self Care | Payer: BLUE CROSS/BLUE SHIELD | Attending: Emergency Medicine | Admitting: Emergency Medicine

## 2014-09-08 ENCOUNTER — Encounter (HOSPITAL_COMMUNITY): Payer: Self-pay | Admitting: Emergency Medicine

## 2014-09-08 ENCOUNTER — Emergency Department (HOSPITAL_COMMUNITY): Payer: BLUE CROSS/BLUE SHIELD

## 2014-09-08 DIAGNOSIS — Z8742 Personal history of other diseases of the female genital tract: Secondary | ICD-10-CM | POA: Diagnosis not present

## 2014-09-08 DIAGNOSIS — Z8619 Personal history of other infectious and parasitic diseases: Secondary | ICD-10-CM | POA: Insufficient documentation

## 2014-09-08 DIAGNOSIS — Y9389 Activity, other specified: Secondary | ICD-10-CM | POA: Diagnosis not present

## 2014-09-08 DIAGNOSIS — I10 Essential (primary) hypertension: Secondary | ICD-10-CM | POA: Insufficient documentation

## 2014-09-08 DIAGNOSIS — Y998 Other external cause status: Secondary | ICD-10-CM | POA: Insufficient documentation

## 2014-09-08 DIAGNOSIS — X58XXXA Exposure to other specified factors, initial encounter: Secondary | ICD-10-CM | POA: Insufficient documentation

## 2014-09-08 DIAGNOSIS — S39012A Strain of muscle, fascia and tendon of lower back, initial encounter: Secondary | ICD-10-CM | POA: Diagnosis not present

## 2014-09-08 DIAGNOSIS — Y9289 Other specified places as the place of occurrence of the external cause: Secondary | ICD-10-CM | POA: Diagnosis not present

## 2014-09-08 DIAGNOSIS — Z79899 Other long term (current) drug therapy: Secondary | ICD-10-CM | POA: Insufficient documentation

## 2014-09-08 DIAGNOSIS — Z85828 Personal history of other malignant neoplasm of skin: Secondary | ICD-10-CM | POA: Diagnosis not present

## 2014-09-08 DIAGNOSIS — F419 Anxiety disorder, unspecified: Secondary | ICD-10-CM | POA: Insufficient documentation

## 2014-09-08 DIAGNOSIS — S3992XA Unspecified injury of lower back, initial encounter: Secondary | ICD-10-CM | POA: Diagnosis present

## 2014-09-08 MED ORDER — DIAZEPAM 5 MG PO TABS: 5.0000 mg | ORAL_TABLET | Freq: Three times a day (TID) | ORAL | Status: DC | PRN

## 2014-09-08 MED ORDER — IBUPROFEN 600 MG PO TABS: 600.0000 mg | ORAL_TABLET | Freq: Four times a day (QID) | ORAL | Status: DC | PRN

## 2014-09-08 NOTE — ED Notes (Signed)
Pt ambulated to bathroom without assistance. Pt issues noted with gait.

## 2014-09-08 NOTE — Discharge Instructions (Signed)
Lumbosacral Strain Lumbosacral strain is a strain of any of the parts that make up your lumbosacral vertebrae. Your lumbosacral vertebrae are the bones that make up the lower third of your backbone. Your lumbosacral vertebrae are held together by muscles and tough, fibrous tissue (ligaments).  CAUSES  A sudden blow to your back can cause lumbosacral strain. Also, anything that causes an excessive stretch of the muscles in the low back can cause this strain. This is typically seen when people exert themselves strenuously, fall, lift heavy objects, bend, or crouch repeatedly. RISK FACTORS  Physically demanding work.  Participation in pushing or pulling sports or sports that require a sudden twist of the back (tennis, golf, baseball).  Weight lifting.  Excessive lower back curvature.  Forward-tilted pelvis.  Weak back or abdominal muscles or both.  Tight hamstrings. SIGNS AND SYMPTOMS  Lumbosacral strain may cause pain in the area of your injury or pain that moves (radiates) down your leg.  DIAGNOSIS Your health care provider can often diagnose lumbosacral strain through a physical exam. In some cases, you may need tests such as X-ray exams.  TREATMENT  Treatment for your lower back injury depends on many factors that your clinician will have to evaluate. However, most treatment will include the use of anti-inflammatory medicines. HOME CARE INSTRUCTIONS   Avoid hard physical activities (tennis, racquetball, waterskiing) if you are not in proper physical condition for it. This may aggravate or create problems.  If you have a back problem, avoid sports requiring sudden body movements. Swimming and walking are generally safer activities.  Maintain good posture.  Maintain a healthy weight.  For acute conditions, you may put ice on the injured area.  Put ice in a plastic bag.  Place a towel between your skin and the bag.  Leave the ice on for 20 minutes, 2-3 times a day.  When the  low back starts healing, stretching and strengthening exercises may be recommended. SEEK MEDICAL CARE IF:  Your back pain is getting worse.  You experience severe back pain not relieved with medicines. SEEK IMMEDIATE MEDICAL CARE IF:   You have numbness, tingling, weakness, or problems with the use of your arms or legs.  There is a change in bowel or bladder control.  You have increasing pain in any area of the body, including your belly (abdomen).  You notice shortness of breath, dizziness, or feel faint.  You feel sick to your stomach (nauseous), are throwing up (vomiting), or become sweaty.  You notice discoloration of your toes or legs, or your feet get very cold. MAKE SURE YOU:   Understand these instructions.  Will watch your condition.  Will get help right away if you are not doing well or get worse. Document Released: 12/23/2004 Document Revised: 03/20/2013 Document Reviewed: 11/01/2012 [facility] Patient Information 2015 [place], [place]. This information is not intended to replace advice given to you by your health care provider. Make sure you discuss any questions you have with your health care provider.     Do not drive within 4 hours of taking valium as this will make you drowsy.  Avoid lifting,  Bending,  Twisting or any other activity that worsens your pain over the next week.  Apply an  icepack  to your lower back for 10-15 minutes every 2 hours for the next 2 days.  You should get rechecked if your symptoms are not better over the next 5 days,  Or you develop increased pain,  Weakness in your leg(s)  or loss of bladder or bowel function - these are symptoms of a worse injury. Your xrays are ok today.

## 2014-09-08 NOTE — ED Notes (Signed)
PT stated she was getting something out of her refrigerator and had a sharp back in her lower back. PT c/o lower back pain with soreness. PT states recent blood work at MD office for possible arthritis of her joints.

## 2014-09-09 NOTE — Telephone Encounter (Signed)
Pt hurt back,seen in ER and treated with motrin and valuim and feels better all over now,but may still want labs, will let me know

## 2014-09-10 NOTE — ED Provider Notes (Signed)
CSN: [id number]     Arrival date & time 09/08/14  1023 History   First MD Initiated Contact with Patient 09/08/14 1142     Chief Complaint  Patient presents with  . Back Pain     (Consider location/radiation/quality/duration/timing/severity/associated sxs/prior Treatment) The history is provided by the patient.   Shannon Lindsey is a 49 y.o. female with a past medical history significant for HTN , anxiety and skin cancer presenting with sudden onset of pain in her lower back when bending over picking up an object from her refrigerator this am.  Her pain is constant but worsened with movement and walking.   There is no radiation into her legs and no weakness or numbness in the lower extremities or urinary or bowel retention or incontinence.  Patient does not have a history of IVDU.  The patient has taken no medications prior to arrival.  She also reports generalized joint pain which is currently being evaluated by her pcp. She has had blood tests drawn looking for arthritis or other source of her symptoms and is awaiting results. She describes increased anxiety, especially over concerns about her aging parents. She was called twice this past week to come help her father get up after falling late at night. She does not sleep well at night waiting for phone calls.    Past Medical History  Diagnosis Date  . HSV-2 (herpes simplex virus 2) infection   . Hypertension   . HSV-1 (herpes simplex virus 1) infection   . Abnormal Pap smear   . Warts, genital   . H/O hiatal hernia   . Cancer     squamous cell chest  . Vaginal Pap smear, abnormal   . Vaginal dryness 07/17/2014   Past Surgical History  Procedure Laterality Date  . Cesarean section    . Tonsillectomy    . Exploratory laparotomy    . Colposcopy w/ biopsy / curettage    . Tubal ligation    . Mole removal      has had 30 + removed not cancer  . Melanoma excision Right 12/25/2012    Procedure: wide EXCISION squamous cell carcinoma  right chest ;  Surgeon: Zenovia Jarred, MD;  Location: [place];  Service: General;  Laterality: Right;   Family History  Problem Relation Age of Onset  . Cancer Father     colon and lung  . COPD Father   . Atrial fibrillation Mother   . Diabetes Mother   . Stroke Sister   . Heart attack Brother   . Other Daughter     benign pituitary tumor   History  Substance Use Topics  . Smoking status: Never Smoker   . Smokeless tobacco: Never Used  . Alcohol Use: Yes     Comment: occ.   OB History    Gravida Para Term Preterm AB TAB SAB Ectopic Multiple Living   2 2        2      Review of Systems  Constitutional: Negative for fever.  Respiratory: Negative for shortness of breath.   Cardiovascular: Negative for chest pain and leg swelling.  Gastrointestinal: Negative for abdominal pain, constipation and abdominal distention.  Genitourinary: Negative for dysuria, urgency, frequency, flank pain and difficulty urinating.  Musculoskeletal: Positive for back pain. Negative for joint swelling and gait problem.  Skin: Negative for rash.  Neurological: Negative for weakness and numbness.  Psychiatric/Behavioral: The patient is nervous/anxious.       Allergies  Erythromycin  Home Medications   Prior to Admission medications   Medication Sig Start Date End Date Taking? Authorizing Provider  lisinopril-hydrochlorothiazide (PRINZIDE,ZESTORETIC) 20-12.5 MG per tablet Take 1 tablet by mouth daily. 07/17/14  Yes Estill Dooms, NP  valACYclovir (VALTREX) 1000 MG tablet TAKE ONE TABLET BY MOUTH ONCE DAILY 05/06/14  Yes Estill Dooms, NP  diazepam (VALIUM) 5 MG tablet Take 1 tablet (5 mg total) by mouth every 8 (eight) hours as needed for anxiety or muscle spasms. 09/08/14   Evalee Jefferson, PA-C  ibuprofen (ADVIL,MOTRIN) 600 MG tablet Take 1 tablet (600 mg total) by mouth every 6 (six) hours as needed. 09/08/14   Evalee Jefferson, PA-C   BP 132/80 mmHg  Pulse 65  Temp(Src) 98.1 F (36.7 C) (Oral)   Resp 18  Ht 5\' 3"  (1.6 m)  Wt 150 lb (68.04 kg)  BMI 26.58 kg/m2  SpO2 100% Physical Exam  Constitutional: She appears well-developed and well-nourished.  HENT:  Head: Normocephalic.  Eyes: Conjunctivae are normal.  Neck: Normal range of motion. Neck supple.  Cardiovascular: Normal rate and intact distal pulses.   Pedal pulses normal.  Pulmonary/Chest: Effort normal.  Abdominal: Soft. Bowel sounds are normal. She exhibits no distension and no mass.  Musculoskeletal: Normal range of motion. She exhibits no edema.       Lumbar back: She exhibits tenderness. She exhibits no swelling, no edema and no spasm.  midlumbar and paralumbar ttp.  Neurological: She is alert. She has normal strength. She displays no atrophy and no tremor. No sensory deficit. Gait normal.  Reflex Scores:      Patellar reflexes are 2+ on the right side and 2+ on the left side.      Achilles reflexes are 2+ on the right side and 2+ on the left side. No strength deficit noted in hip and knee flexor and extensor muscle groups.  Ankle flexion and extension intact.  Skin: Skin is warm and dry.  Psychiatric: She has a normal mood and affect.  Appears mildly anxious  Nursing note and vitals reviewed.   ED Course  Procedures (including critical care time) Labs Review Labs Reviewed - No data to display  Imaging Review No results found.   EKG Interpretation None      MDM   Final diagnoses:  Lumbar strain, initial encounter  Anxiety    Patients labs and/or radiological studies were reviewed and considered during the medical decision making and disposition process.  Results were also discussed with patient. No findings on imaging, no mets, no obvious trauma, mild spondylosis.  She was advised activity as tolerated, ice tx, heat tx starting on day 2, ibuprofen, valium for muscle spasm/strain, also for anxiety. Advised re sedation with this medicine, plan f/u with pcp if sx persist or worsen.  No neuro deficit  on exam or by history to suggest emergent or surgical presentation.  Discussed worsened sx that should prompt immediate re-evaluation including distal weakness, bowel/bladder retention/incontinence.         Evalee Jefferson, PA-C 09/10/14 1312  Quintella Reichert, MD 09/10/14 [phone]

## 2014-09-18 ENCOUNTER — Encounter (INDEPENDENT_AMBULATORY_CARE_PROVIDER_SITE_OTHER): Payer: Self-pay | Admitting: Gastroenterology

## 2014-09-18 NOTE — Progress Notes (Signed)
   Subjective:    Patient ID: Shannon Lindsey, female    DOB: 11-09-1965, 49 y.o.   MRN: [id number]  Robert Bellow, MD   HPI   Past Medical History  Diagnosis Date  . HSV-2 (herpes simplex virus 2) infection   . Hypertension   . HSV-1 (herpes simplex virus 1) infection   . Abnormal Pap smear   . Warts, genital   . H/O hiatal hernia   . Cancer     squamous cell chest  . Vaginal Pap smear, abnormal   . Vaginal dryness 07/17/2014    Past Surgical History  Procedure Laterality Date  . Cesarean section    . Tonsillectomy    . Exploratory laparotomy    . Colposcopy w/ biopsy / curettage    . Tubal ligation    . Mole removal      has had 30 + removed not cancer  . Melanoma excision Right 12/25/2012    Procedure: wide EXCISION squamous cell carcinoma right chest ;  Surgeon: Zenovia Jarred, MD;  Location: [place];  Service: General;  Laterality: Right;    Family History  Problem Relation Age of Onset  . Cancer Father     colon and lung  . COPD Father   . Atrial fibrillation Mother   . Diabetes Mother   . Stroke Sister   . Heart attack Brother   . Other Daughter     benign pituitary tumor   Allergies  Allergen Reactions  . Erythromycin Other (See Comments)    Gets a UTI every time.     Family History  Problem Relation Age of Onset  . Cancer Father     colon and lung  . COPD Father   . Atrial fibrillation Mother   . Diabetes Mother   . Stroke Sister   . Heart attack Brother   . Other Daughter     benign pituitary tumor    History  Substance Use Topics  . Smoking status: Never Smoker   . Smokeless tobacco: Never Used  . Alcohol Use: Yes     Comment: occ.     Review of Systems PER HPI OTHERWISE ALL SYSTEMS ARE NEGATIVE.      Objective:   Physical Exam        Assessment & Plan:

## 2014-10-23 ENCOUNTER — Ambulatory Visit (HOSPITAL_COMMUNITY): Payer: Self-pay

## 2014-11-15 ENCOUNTER — Other Ambulatory Visit: Payer: Self-pay | Admitting: Obstetrics and Gynecology

## 2014-11-15 DIAGNOSIS — Z1231 Encounter for screening mammogram for malignant neoplasm of breast: Secondary | ICD-10-CM

## 2014-11-21 ENCOUNTER — Ambulatory Visit (HOSPITAL_COMMUNITY)
Admission: RE | Admit: 2014-11-21 | Discharge: 2014-11-21 | Disposition: A | Discharge status: Home / Self Care | Payer: BLUE CROSS/BLUE SHIELD | Source: Ambulatory Visit | Attending: Obstetrics and Gynecology | Admitting: Obstetrics and Gynecology

## 2014-11-21 DIAGNOSIS — Z1231 Encounter for screening mammogram for malignant neoplasm of breast: Secondary | ICD-10-CM | POA: Diagnosis not present

## 2015-08-01 ENCOUNTER — Telehealth: Payer: Self-pay | Admitting: Adult Health

## 2015-08-01 NOTE — Telephone Encounter (Signed)
Her ex has Hept C wants to be tested at physical, will make appt soon

## 2015-08-05 ENCOUNTER — Other Ambulatory Visit: Payer: Self-pay | Admitting: Adult Health

## 2015-08-05 MED ORDER — VALACYCLOVIR HCL 1 G PO TABS: 1000.0000 mg | ORAL_TABLET | Freq: Every day | ORAL | Status: DC

## 2015-08-27 DIAGNOSIS — L570 Actinic keratosis: Secondary | ICD-10-CM | POA: Diagnosis not present

## 2015-08-27 DIAGNOSIS — X32XXXD Exposure to sunlight, subsequent encounter: Secondary | ICD-10-CM | POA: Diagnosis not present

## 2015-08-27 DIAGNOSIS — D225 Melanocytic nevi of trunk: Secondary | ICD-10-CM | POA: Diagnosis not present

## 2015-08-27 DIAGNOSIS — Z1283 Encounter for screening for malignant neoplasm of skin: Secondary | ICD-10-CM | POA: Diagnosis not present

## 2015-08-27 DIAGNOSIS — L820 Inflamed seborrheic keratosis: Secondary | ICD-10-CM | POA: Diagnosis not present

## 2015-09-01 ENCOUNTER — Telehealth: Payer: Self-pay | Admitting: Adult Health

## 2015-09-01 NOTE — Telephone Encounter (Signed)
Pt called stating that she would like a call back from [place], [place] did not state the reason why. Please contact pt

## 2015-09-01 NOTE — Telephone Encounter (Signed)
Has appt next week, will get labs then, had tick bite 2 months ago

## 2015-09-08 ENCOUNTER — Other Ambulatory Visit: Payer: BLUE CROSS/BLUE SHIELD | Admitting: Adult Health

## 2015-09-08 ENCOUNTER — Other Ambulatory Visit: Payer: BLUE CROSS/BLUE SHIELD

## 2015-09-08 DIAGNOSIS — Z01419 Encounter for gynecological examination (general) (routine) without abnormal findings: Secondary | ICD-10-CM

## 2015-09-09 ENCOUNTER — Encounter: Payer: Self-pay | Admitting: Adult Health

## 2015-09-09 ENCOUNTER — Telehealth: Payer: Self-pay | Admitting: Adult Health

## 2015-09-09 DIAGNOSIS — E559 Vitamin D deficiency, unspecified: Secondary | ICD-10-CM

## 2015-09-09 HISTORY — DX: Vitamin D deficiency, unspecified: E55.9

## 2015-09-09 LAB — COMPREHENSIVE METABOLIC PANEL
ALT: 19 IU/L (ref 0–32)
AST: 16 IU/L (ref 0–40)
Albumin/Globulin Ratio: 1.7 (ref 1.2–2.2)
Albumin: 4.6 g/dL (ref 3.5–5.5)
Alkaline Phosphatase: 87 IU/L (ref 39–117)
BILIRUBIN TOTAL: 0.3 mg/dL (ref 0.0–1.2)
BUN/Creatinine Ratio: 23 (ref 9–23)
BUN: 19 mg/dL (ref 6–24)
CHLORIDE: 97 mmol/L (ref 96–106)
CO2: 26 mmol/L (ref 18–29)
Calcium: 9.4 mg/dL (ref 8.7–10.2)
Creatinine, Ser: 0.84 mg/dL (ref 0.57–1.00)
GFR calc Af Amer: 92 mL/min/{1.73_m2} (ref >59)
GFR calc non Af Amer: 80 mL/min/{1.73_m2} (ref >59)
Globulin, Total: 2.7 g/dL (ref 1.5–4.5)
Glucose: 89 mg/dL (ref 65–99)
Potassium: 4.6 mmol/L (ref 3.5–5.2)
Sodium: 139 mmol/L (ref 134–144)
Total Protein: 7.3 g/dL (ref 6.0–8.5)

## 2015-09-09 LAB — CBC
Hematocrit: 42.4 % (ref 34.0–46.6)
Hemoglobin: 14.1 g/dL (ref 11.1–15.9)
MCH: 28.3 pg (ref 26.6–33.0)
MCHC: 33.3 g/dL (ref 31.5–35.7)
MCV: 85 fL (ref 79–97)
PLATELETS: 331 10*3/uL (ref 150–379)
RBC: 4.99 x10E6/uL (ref 3.77–5.28)
RDW: 13.7 % (ref 12.3–15.4)
WBC: 4.7 10*3/uL (ref 3.4–10.8)

## 2015-09-09 LAB — LIPID PANEL
CHOLESTEROL TOTAL: 235 mg/dL — AB (ref 100–199)
Chol/HDL Ratio: 3.9 ratio units (ref 0.0–4.4)
HDL: 61 mg/dL (ref >39)
LDL Calculated: 157 mg/dL — ABNORMAL HIGH (ref 0–99)
TRIGLYCERIDES: 86 mg/dL (ref 0–149)
VLDL Cholesterol Cal: 17 mg/dL (ref 5–40)

## 2015-09-09 LAB — TSH: TSH: 1.74 u[IU]/mL (ref 0.450–4.500)

## 2015-09-09 LAB — HEPATITIS C ANTIBODY

## 2015-09-09 LAB — HEMOGLOBIN A1C
Est. average glucose Bld gHb Est-mCnc: 120 mg/dL
Hgb A1c MFr Bld: 5.8 % — ABNORMAL HIGH (ref 4.8–5.6)

## 2015-09-09 LAB — VITAMIN D 25 HYDROXY (VIT D DEFICIENCY, FRACTURES): Vit D, 25-Hydroxy: 8.5 ng/mL — ABNORMAL LOW (ref 30.0–100.0)

## 2015-09-09 MED ORDER — CHOLECALCIFEROL 125 MCG (5000 UT) PO CAPS: 5000.0000 [IU] | ORAL_CAPSULE | Freq: Every day | ORAL | Status: DC

## 2015-09-09 NOTE — Telephone Encounter (Signed)
Pt aware of labs, take vitamin D 3 5000 IU per day, is achy in wrists and ankles at times

## 2015-09-29 ENCOUNTER — Other Ambulatory Visit (HOSPITAL_COMMUNITY)
Admission: RE | Admit: 2015-09-29 | Discharge: 2015-09-29 | Disposition: A | Discharge status: Home / Self Care | Payer: BLUE CROSS/BLUE SHIELD | Source: Ambulatory Visit | Attending: Adult Health | Admitting: Adult Health

## 2015-09-29 ENCOUNTER — Ambulatory Visit (INDEPENDENT_AMBULATORY_CARE_PROVIDER_SITE_OTHER): Payer: BLUE CROSS/BLUE SHIELD | Admitting: Adult Health

## 2015-09-29 ENCOUNTER — Encounter: Payer: Self-pay | Admitting: Adult Health

## 2015-09-29 VITALS — BP 118/82 | HR 74 | Ht 63.0 in | Wt 164.0 lb

## 2015-09-29 DIAGNOSIS — R319 Hematuria, unspecified: Secondary | ICD-10-CM | POA: Diagnosis not present

## 2015-09-29 DIAGNOSIS — R300 Dysuria: Secondary | ICD-10-CM | POA: Diagnosis not present

## 2015-09-29 DIAGNOSIS — Z139 Encounter for screening, unspecified: Secondary | ICD-10-CM

## 2015-09-29 DIAGNOSIS — Z1151 Encounter for screening for human papillomavirus (HPV): Secondary | ICD-10-CM | POA: Diagnosis not present

## 2015-09-29 DIAGNOSIS — I10 Essential (primary) hypertension: Secondary | ICD-10-CM | POA: Diagnosis not present

## 2015-09-29 DIAGNOSIS — Z01419 Encounter for gynecological examination (general) (routine) without abnormal findings: Secondary | ICD-10-CM

## 2015-09-29 DIAGNOSIS — G5603 Carpal tunnel syndrome, bilateral upper limbs: Secondary | ICD-10-CM

## 2015-09-29 DIAGNOSIS — Z01411 Encounter for gynecological examination (general) (routine) with abnormal findings: Secondary | ICD-10-CM | POA: Diagnosis not present

## 2015-09-29 DIAGNOSIS — Z1211 Encounter for screening for malignant neoplasm of colon: Secondary | ICD-10-CM

## 2015-09-29 HISTORY — DX: Dysuria: R30.0

## 2015-09-29 HISTORY — DX: Carpal tunnel syndrome, bilateral upper limbs: G56.03

## 2015-09-29 LAB — POCT URINALYSIS DIPSTICK
Glucose, UA: NEGATIVE
Nitrite, UA: NEGATIVE
Protein, UA: NEGATIVE

## 2015-09-29 LAB — HEMOCCULT GUIAC POC 1CARD (OFFICE): Fecal Occult Blood, POC: NEGATIVE

## 2015-09-29 MED ORDER — LISINOPRIL-HYDROCHLOROTHIAZIDE 20-12.5 MG PO TABS: 1.0000 | ORAL_TABLET | Freq: Every day | ORAL | Status: DC

## 2015-09-29 NOTE — Progress Notes (Addendum)
Patient ID: Shannon Lindsey, female   DOB: [birth year], 50 y.o.   MRN: [id number] History of Present Illness:  Shannon Lindsey is a 50 year old white female, separated in for a well woman gyn exam and pap.She complains of body aches and hands go to sleep easily and has loss of grip.Has had some burning with urination and decreased libido, has used AZO and that helps. Says her waist is thicker.  PCP is Dr Karie Kirks.  Current Medications, Allergies, Past Medical History, Past Surgical History, Family History and Social History were reviewed in [employer] record.     Review of Systems: Patient denies any headaches, hearing loss, fatigue, blurred vision, shortness of breath, chest pain, abdominal pain, problems with bowel movements, or intercourse(not having sex). No joint pain or mood swings.See HPI for positives.     Physical Exam:BP 118/82 mmHg  Pulse 74  Ht 5\' 3"  (1.6 m)  Wt 164 lb (74.39 kg)  BMI 29.06 kg/m2 Urine dipstick: trace blood an trace leuks General:  Well developed, well nourished, no acute distress Skin:  Warm and dry Neck:  Midline trachea, normal thyroid, good ROM, no lymphadenopathy Lungs; Clear to auscultation bilaterally Breast:  No dominant palpable mass, retraction, or nipple discharge Cardiovascular: Regular rate and rhythm Abdomen:  Soft, non tender, no hepatosplenomegaly Pelvic:  External genitalia is normal in appearance, no lesions.  The vagina is normal in appearance. Urethra has no lesions or masses. The cervix is smooth.Pap with HPV performed.  Uterus is felt to be normal size, shape, and contour.  No adnexal masses or tenderness noted.Bladder is non tender, no masses felt. Rectal: Good sphincter tone, no polyps, or hemorrhoids felt.  Hemoccult negative. Extremities/musculoskeletal:  No swelling or varicosities noted, no clubbing or cyanosis, +Phalen's sign, she declines referral to hand doctor, then don't sleep in rings, may try splints if  continues Psych:  No mood changes, alert and cooperative,seems happy   Impression: Well woman gyn exam and pap Burning with urination Hematuria Hypertension CTS bilateral     Plan: UA C&S sent, push fluids Physical in 1 year, pap in 3 if normal Mammogram yearly Referred to Dr Gala Romney for colonoscopy Don't wear rings at night  Decrease carbs  Refilled lisinopril-hydrochlorothiazide 20-12.5 mg #90 with 3 refills

## 2015-09-29 NOTE — Patient Instructions (Signed)
Carpal Tunnel Syndrome Carpal tunnel syndrome is a condition that causes pain in your hand and arm. The carpal tunnel is a narrow area that is on the palm side of your wrist. Repeated wrist motion or certain diseases may cause swelling in the tunnel. This swelling can pinch the main nerve in the wrist (median nerve).  HOME CARE If You Have a Splint:  Wear it as told by your doctor. Remove it only as told by your doctor.  Loosen the splint if your fingers:  Become numb and tingle.  Turn blue and cold.  Keep the splint clean and dry. General Instructions  Take over-the-counter and prescription medicines only as told by your doctor.  Rest your wrist from any activity that may be causing your pain. If needed, talk to your employer about changes that can be made in your work, such as getting a wrist pad to use while typing.  If directed, apply ice to the painful area:  Put ice in a plastic bag.  Place a towel between your skin and the bag.  Leave the ice on for 20 minutes, 2-3 times per day.  Keep all follow-up visits as told by your doctor. This is important.  Do any exercises as told by your doctor, physical therapist, or occupational therapist. GET HELP IF:  You have new symptoms.  Medicine does not help your pain.  Your symptoms get worse.   This information is not intended to replace advice given to you by your health care provider. Make sure you discuss any questions you have with your health care provider.   Document Released: 03/04/2011 Document Revised: 12/04/2014 Document Reviewed: 07/31/2014 Elsevier Interactive Patient Education [employer]. Physical in 1 year Mammogram yearly get 11/21/15  Colonoscopy advised

## 2015-09-30 LAB — URINALYSIS, ROUTINE W REFLEX MICROSCOPIC
Bilirubin, UA: NEGATIVE
GLUCOSE, UA: NEGATIVE
Ketones, UA: NEGATIVE
NITRITE UA: NEGATIVE
PH UA: 6 (ref 5.0–7.5)
Protein, UA: NEGATIVE
Specific Gravity, UA: 1.024 (ref 1.005–1.030)
UUROB: 0.2 mg/dL (ref 0.2–1.0)

## 2015-09-30 LAB — MICROSCOPIC EXAMINATION: CASTS: NONE SEEN /LPF

## 2015-10-01 LAB — CYTOLOGY - PAP

## 2015-10-02 LAB — URINE CULTURE

## 2015-10-03 ENCOUNTER — Telehealth: Payer: Self-pay | Admitting: Adult Health

## 2015-10-03 MED ORDER — SULFAMETHOXAZOLE-TRIMETHOPRIM 800-160 MG PO TABS
1.0000 | ORAL_TABLET | Freq: Two times a day (BID) | ORAL | Status: DC
Start: 2015-10-03 — End: 2016-02-26

## 2015-10-03 NOTE — Telephone Encounter (Signed)
Pt aware pap normal, and urine + e coli, will rx septra ds 1 bid x 7 days and push fluids

## 2015-10-09 ENCOUNTER — Other Ambulatory Visit: Payer: Self-pay | Admitting: Adult Health

## 2015-11-18 ENCOUNTER — Telehealth: Payer: Self-pay

## 2015-11-18 NOTE — Telephone Encounter (Signed)
[phone]   PATIENT RECEIVED LETTER TO SCHEDULE TCS

## 2015-11-25 ENCOUNTER — Telehealth: Payer: Self-pay

## 2015-11-25 NOTE — Telephone Encounter (Signed)
Patient called again about scheduling her tcs   °

## 2015-11-25 NOTE — Telephone Encounter (Signed)
I triaged pt today.

## 2015-11-25 NOTE — Telephone Encounter (Signed)
See separate triage.  

## 2015-12-04 NOTE — Telephone Encounter (Signed)
Gastroenterology Pre-Procedure Review  Request Date: 11/25/2015 Requesting Physician: Derrek Monaco   PATIENT REVIEW QUESTIONS: The patient responded to the following health history questions as indicated:    1. Diabetes Melitis: no 2. Joint replacements in the past 12 months: no 3. Major health problems in the past 3 months: no 4. Has an artificial valve or MVP: no 5. Has a defibrillator: no 6. Has been advised in past to take antibiotics in advance of a procedure like teeth cleaning: no 7. Family history of colon cancer: YES/ Father  Age 67 8. Alcohol Use: YES   One to two drinks a month 9. History of sleep apnea: no     MEDICATIONS & ALLERGIES:    Patient reports the following regarding taking any blood thinners:   Plavix? no Aspirin? NO Coumadin? no  Patient confirms/reports the following medications:  Current Outpatient Prescriptions  Medication Sig Dispense Refill  . Cholecalciferol 5000 units capsule Take 1 capsule (5,000 Units total) by mouth daily.    Marland Kitchen ibuprofen (ADVIL,MOTRIN) 600 MG tablet Take 1 tablet (600 mg total) by mouth every 6 (six) hours as needed. 30 tablet 0  . lisinopril-hydrochlorothiazide (PRINZIDE,ZESTORETIC) 20-12.5 MG tablet TAKE 1 TABLET BY MOUTH EVERY DAY 90 tablet 3  . sulfamethoxazole-trimethoprim (BACTRIM DS,SEPTRA DS) 800-160 MG tablet Take 1 tablet by mouth 2 (two) times daily. (Patient not taking: Reported on 11/25/2015) 14 tablet 0  . valACYclovir (VALTREX) 1000 MG tablet Take 1 tablet (1,000 mg total) by mouth daily. (Patient not taking: Reported on 11/25/2015) 30 tablet prn   No current facility-administered medications for this visit.     Patient confirms/reports the following allergies:  Allergies  Allergen Reactions  . Erythromycin Other (See Comments)    Gets a UTI every time.    No orders of the defined types were placed in this encounter.   AUTHORIZATION INFORMATION Primary Insurance:   ID #:   Group #:  Pre-Cert / Auth  required:  Pre-Cert / Auth #:   Secondary Insurance:   ID #:   Group #:  Pre-Cert / Auth required:  Pre-Cert / Auth #:   SCHEDULE INFORMATION: Procedure has been scheduled as follows:  Date:             Time:   Location:   This Gastroenterology Pre-Precedure Review Form is being routed to the following provider(s): Barney Drain, MD

## 2015-12-09 ENCOUNTER — Other Ambulatory Visit: Payer: Self-pay | Admitting: Obstetrics and Gynecology

## 2015-12-09 DIAGNOSIS — Z1231 Encounter for screening mammogram for malignant neoplasm of breast: Secondary | ICD-10-CM

## 2015-12-11 ENCOUNTER — Other Ambulatory Visit: Payer: Self-pay

## 2015-12-11 ENCOUNTER — Telehealth: Payer: Self-pay

## 2015-12-11 DIAGNOSIS — Z1211 Encounter for screening for malignant neoplasm of colon: Secondary | ICD-10-CM

## 2015-12-11 MED ORDER — PEG 3350-KCL-NA BICARB-NACL 420 G PO SOLR: 4000.0000 mL | ORAL | 0 refills | Status: DC

## 2015-12-11 MED ORDER — NA SULFATE-K SULFATE-MG SULF 17.5-3.13-1.6 GM/177ML PO SOLN: 1.0000 | ORAL | 0 refills | Status: DC

## 2015-12-11 NOTE — Telephone Encounter (Signed)
I called [employer] @ [phone] and spoke to DJ who said a PA is not required for the screening colonoscopy.

## 2015-12-11 NOTE — Telephone Encounter (Signed)
Suprep not covered. Trilyte sent in and instructions mailed to [facility] and pt is aware.

## 2015-12-11 NOTE — Telephone Encounter (Signed)
SUPREP SPLIT DOSING-REGULAR breakfast then CLEAR LIQUIDS after 9 am.   

## 2015-12-12 ENCOUNTER — Ambulatory Visit (HOSPITAL_COMMUNITY)
Admission: RE | Admit: 2015-12-12 | Discharge: 2015-12-12 | Disposition: A | Discharge status: Home / Self Care | Payer: BLUE CROSS/BLUE SHIELD | Source: Ambulatory Visit | Attending: Obstetrics and Gynecology | Admitting: Obstetrics and Gynecology

## 2015-12-12 DIAGNOSIS — Z1231 Encounter for screening mammogram for malignant neoplasm of breast: Secondary | ICD-10-CM

## 2015-12-15 ENCOUNTER — Encounter (HOSPITAL_COMMUNITY): Admission: RE | Payer: Self-pay | Source: Ambulatory Visit

## 2015-12-15 ENCOUNTER — Ambulatory Visit (HOSPITAL_COMMUNITY)
Admission: RE | Admit: 2015-12-15 | Payer: BLUE CROSS/BLUE SHIELD | Source: Ambulatory Visit | Admitting: Gastroenterology

## 2015-12-15 SURGERY — COLONOSCOPY
Anesthesia: Moderate Sedation

## 2016-02-09 DIAGNOSIS — G5603 Carpal tunnel syndrome, bilateral upper limbs: Secondary | ICD-10-CM | POA: Diagnosis not present

## 2016-02-09 DIAGNOSIS — Z23 Encounter for immunization: Secondary | ICD-10-CM | POA: Diagnosis not present

## 2016-02-09 DIAGNOSIS — E663 Overweight: Secondary | ICD-10-CM | POA: Diagnosis not present

## 2016-02-09 DIAGNOSIS — F419 Anxiety disorder, unspecified: Secondary | ICD-10-CM | POA: Diagnosis not present

## 2016-02-26 ENCOUNTER — Emergency Department (HOSPITAL_COMMUNITY): Payer: BLUE CROSS/BLUE SHIELD

## 2016-02-26 ENCOUNTER — Observation Stay (HOSPITAL_COMMUNITY)
Admission: EM | Admit: 2016-02-26 | Discharge: 2016-02-27 | Disposition: A | Discharge status: Home / Self Care | Payer: BLUE CROSS/BLUE SHIELD | Attending: Internal Medicine | Admitting: Internal Medicine

## 2016-02-26 ENCOUNTER — Encounter (HOSPITAL_COMMUNITY): Payer: Self-pay

## 2016-02-26 DIAGNOSIS — R1032 Left lower quadrant pain: Secondary | ICD-10-CM

## 2016-02-26 DIAGNOSIS — Z79899 Other long term (current) drug therapy: Secondary | ICD-10-CM | POA: Insufficient documentation

## 2016-02-26 DIAGNOSIS — I10 Essential (primary) hypertension: Secondary | ICD-10-CM

## 2016-02-26 DIAGNOSIS — R109 Unspecified abdominal pain: Secondary | ICD-10-CM | POA: Diagnosis present

## 2016-02-26 DIAGNOSIS — K5732 Diverticulitis of large intestine without perforation or abscess without bleeding: Secondary | ICD-10-CM | POA: Diagnosis not present

## 2016-02-26 DIAGNOSIS — K5792 Diverticulitis of intestine, part unspecified, without perforation or abscess without bleeding: Secondary | ICD-10-CM | POA: Diagnosis not present

## 2016-02-26 LAB — COMPREHENSIVE METABOLIC PANEL
ALBUMIN: 4.3 g/dL (ref 3.5–5.0)
ALT: 14 U/L (ref 14–54)
ANION GAP: 8 (ref 5–15)
AST: 15 U/L (ref 15–41)
Alkaline Phosphatase: 66 U/L (ref 38–126)
BUN: 23 mg/dL — ABNORMAL HIGH (ref 6–20)
CHLORIDE: 102 mmol/L (ref 101–111)
CO2: 28 mmol/L (ref 22–32)
Calcium: 9.3 mg/dL (ref 8.9–10.3)
Creatinine, Ser: 0.84 mg/dL (ref 0.44–1.00)
GFR calc non Af Amer: >60 mL/min (ref >60)
GLUCOSE: 116 mg/dL — AB (ref 65–99)
Potassium: 4 mmol/L (ref 3.5–5.1)
SODIUM: 138 mmol/L (ref 135–145)
Total Bilirubin: 0.4 mg/dL (ref 0.3–1.2)
Total Protein: 7.3 g/dL (ref 6.5–8.1)

## 2016-02-26 LAB — CBC WITH DIFFERENTIAL/PLATELET
BASOS PCT: 0 %
Basophils Absolute: 0 10*3/uL (ref 0.0–0.1)
EOS ABS: 0.2 10*3/uL (ref 0.0–0.7)
EOS PCT: 2 %
HCT: 41.4 % (ref 36.0–46.0)
HEMOGLOBIN: 13.8 g/dL (ref 12.0–15.0)
LYMPHS ABS: 2 10*3/uL (ref 0.7–4.0)
Lymphocytes Relative: 18 %
MCH: 28.4 pg (ref 26.0–34.0)
MCHC: 33.3 g/dL (ref 30.0–36.0)
MCV: 85.2 fL (ref 78.0–100.0)
Monocytes Absolute: 1 10*3/uL (ref 0.1–1.0)
Monocytes Relative: 9 %
NEUTROS PCT: 71 %
Neutro Abs: 8.2 10*3/uL — ABNORMAL HIGH (ref 1.7–7.7)
PLATELETS: 302 10*3/uL (ref 150–400)
RBC: 4.86 MIL/uL (ref 3.87–5.11)
RDW: 13.2 % (ref 11.5–15.5)
WBC: 11.5 10*3/uL — AB (ref 4.0–10.5)

## 2016-02-26 LAB — URINE MICROSCOPIC-ADD ON

## 2016-02-26 LAB — LIPASE, BLOOD: Lipase: 25 U/L (ref 11–51)

## 2016-02-26 LAB — URINALYSIS, ROUTINE W REFLEX MICROSCOPIC
Bilirubin Urine: NEGATIVE
GLUCOSE, UA: NEGATIVE mg/dL
Ketones, ur: NEGATIVE mg/dL
LEUKOCYTES UA: NEGATIVE
NITRITE: NEGATIVE
PH: 5.5 (ref 5.0–8.0)
Protein, ur: NEGATIVE mg/dL
SPECIFIC GRAVITY, URINE: 1.025 (ref 1.005–1.030)

## 2016-02-26 MED ORDER — ACETAMINOPHEN 650 MG RE SUPP: 650.0000 mg | Freq: Four times a day (QID) | RECTAL | Status: DC | PRN

## 2016-02-26 MED ORDER — ONDANSETRON HCL 4 MG/2ML IJ SOLN
4.0000 mg | Freq: Once | INTRAMUSCULAR | Status: AC
  Administered 2016-02-26: 4 mg via INTRAVENOUS
  Filled 2016-02-26: qty 2

## 2016-02-26 MED ORDER — IOPAMIDOL (ISOVUE-300) INJECTION 61%
INTRAVENOUS | Status: AC
  Administered 2016-02-26: 30 mL
  Filled 2016-02-26: qty 30

## 2016-02-26 MED ORDER — SODIUM CHLORIDE 0.9 % IV BOLUS (SEPSIS)
500.0000 mL | Freq: Once | INTRAVENOUS | Status: AC
  Administered 2016-02-26: 500 mL via INTRAVENOUS

## 2016-02-26 MED ORDER — ONDANSETRON HCL 4 MG/2ML IJ SOLN: 4.0000 mg | Freq: Four times a day (QID) | INTRAMUSCULAR | Status: DC | PRN

## 2016-02-26 MED ORDER — METRONIDAZOLE IN NACL 5-0.79 MG/ML-% IV SOLN
500.0000 mg | Freq: Three times a day (TID) | INTRAVENOUS | Status: DC
  Administered 2016-02-26 (×2): 500 mg via INTRAVENOUS
  Filled 2016-02-26 (×2): qty 100

## 2016-02-26 MED ORDER — ESCITALOPRAM OXALATE 10 MG PO TABS
10.0000 mg | ORAL_TABLET | Freq: Every day | ORAL | Status: DC
  Administered 2016-02-27: 10 mg via ORAL
  Filled 2016-02-26 (×2): qty 1

## 2016-02-26 MED ORDER — LISINOPRIL 10 MG PO TABS
20.0000 mg | ORAL_TABLET | Freq: Every day | ORAL | Status: DC
  Administered 2016-02-26 – 2016-02-27 (×2): 20 mg via ORAL
  Filled 2016-02-26 (×2): qty 2

## 2016-02-26 MED ORDER — ENOXAPARIN SODIUM 40 MG/0.4ML ~~LOC~~ SOLN
40.0000 mg | SUBCUTANEOUS | Status: DC
  Administered 2016-02-26: 40 mg via SUBCUTANEOUS
  Filled 2016-02-26: qty 0.4

## 2016-02-26 MED ORDER — FENTANYL CITRATE (PF) 100 MCG/2ML IJ SOLN
50.0000 ug | Freq: Once | INTRAMUSCULAR | Status: AC
  Administered 2016-02-26: 50 ug via INTRAVENOUS
  Filled 2016-02-26: qty 2

## 2016-02-26 MED ORDER — IOPAMIDOL (ISOVUE-300) INJECTION 61%
100.0000 mL | Freq: Once | INTRAVENOUS | Status: AC | PRN
Start: 2016-02-26 — End: 2016-02-26
  Administered 2016-02-26: 100 mL via INTRAVENOUS

## 2016-02-26 MED ORDER — ACETAMINOPHEN 325 MG PO TABS: 650.0000 mg | ORAL_TABLET | Freq: Four times a day (QID) | ORAL | Status: DC | PRN

## 2016-02-26 MED ORDER — CIPROFLOXACIN IN D5W 400 MG/200ML IV SOLN
400.0000 mg | Freq: Two times a day (BID) | INTRAVENOUS | Status: DC
  Administered 2016-02-26 – 2016-02-27 (×2): 400 mg via INTRAVENOUS
  Filled 2016-02-26 (×2): qty 200

## 2016-02-26 MED ORDER — ONDANSETRON HCL 4 MG PO TABS: 4.0000 mg | ORAL_TABLET | Freq: Four times a day (QID) | ORAL | Status: DC | PRN

## 2016-02-26 MED ORDER — SODIUM CHLORIDE 0.9 % IV SOLN
INTRAVENOUS | Status: DC
  Administered 2016-02-26: 1000 mL via INTRAVENOUS

## 2016-02-26 MED ORDER — VALACYCLOVIR HCL 500 MG PO TABS
1000.0000 mg | ORAL_TABLET | Freq: Every day | ORAL | Status: DC
  Filled 2016-02-26 (×5): qty 2

## 2016-02-26 MED ORDER — VITAMIN D 1000 UNITS PO TABS
5000.0000 [IU] | ORAL_TABLET | Freq: Every day | ORAL | Status: DC
  Administered 2016-02-26 – 2016-02-27 (×2): 5000 [IU] via ORAL
  Filled 2016-02-26 (×5): qty 5

## 2016-02-26 MED ORDER — LISINOPRIL-HYDROCHLOROTHIAZIDE 20-12.5 MG PO TABS: 1.0000 | ORAL_TABLET | Freq: Every day | ORAL | Status: DC

## 2016-02-26 MED ORDER — SODIUM CHLORIDE 0.9 % IV SOLN
3.0000 g | Freq: Once | INTRAVENOUS | Status: AC
  Administered 2016-02-26: 3 g via INTRAVENOUS
  Filled 2016-02-26: qty 3

## 2016-02-26 MED ORDER — SODIUM CHLORIDE 0.9 % IV BOLUS (SEPSIS)
1000.0000 mL | Freq: Once | INTRAVENOUS | Status: AC
  Administered 2016-02-26: 1000 mL via INTRAVENOUS

## 2016-02-26 MED ORDER — HYDROCODONE-ACETAMINOPHEN 5-325 MG PO TABS: 1.0000 | ORAL_TABLET | ORAL | Status: DC | PRN

## 2016-02-26 MED ORDER — SODIUM CHLORIDE 0.9 % IV SOLN
INTRAVENOUS | Status: DC
  Administered 2016-02-26 (×2): via INTRAVENOUS

## 2016-02-26 MED ORDER — HYDROCHLOROTHIAZIDE 12.5 MG PO CAPS
12.5000 mg | ORAL_CAPSULE | Freq: Every day | ORAL | Status: DC
  Administered 2016-02-26 – 2016-02-27 (×2): 12.5 mg via ORAL
  Filled 2016-02-26 (×2): qty 1

## 2016-02-26 NOTE — ED Provider Notes (Signed)
[facility] DEPT Provider Note   CSN: [id number] Arrival date & time: 02/26/16  0017  Time seen 02:40 AM   History   Chief Complaint No chief complaint on file. Abdominal pain  HPI Shannon Lindsey is a 50 y.o. female.  HPI  patient reports she ate a lot of walnuts at Thanksgiving. She states she has had a colonoscopy in the past and was told she had diverticulosis. She reports November 25 she started getting pain in her left lower quadrant. She reports having diarrhea "several times a day". She states the pain was persistent but seen to be doing better until tonight when it seemed to be getting worse and started spreading into her upper abdomen which she describes as a burning sensation. She denies any blood in her diarrhea. She's had nausea tonight without vomiting. She denies any fever, dysuria or frequency. She states she's had a normal appetite. She states she's never had this pain before.  PCP Robert Bellow, MD   Past Medical History:  Diagnosis Date  . Abnormal Pap smear   . Burning with urination 09/29/2015  . Cancer (HCC)    squamous cell chest  . Carpal tunnel syndrome, bilateral 09/29/2015  . H/O hiatal hernia   . HSV-1 (herpes simplex virus 1) infection   . HSV-2 (herpes simplex virus 2) infection   . Hypertension   . Vaginal dryness 07/17/2014  . Vaginal Pap smear, abnormal   . Vitamin D deficiency 09/09/2015  . Warts, genital     Patient Active Problem List   Diagnosis Date Noted  . Acute diverticulitis 02/26/2016  . Burning with urination 09/29/2015  . Carpal tunnel syndrome, bilateral 09/29/2015  . Vitamin D deficiency 09/09/2015  . Vaginal dryness 07/17/2014  . Indigestion 07/17/2014  . Squamous cell carcinoma of skin 12/15/2012  . Warts, genital   . HTN (hypertension) 07/11/2012  . HSV-2 (herpes simplex virus 2) infection 07/11/2012  . HEMOCCULT POSITIVE STOOL 09/16/2009  . EPIGASTRIC PAIN 09/15/2009    Past Surgical History:  Procedure  Laterality Date  . CESAREAN SECTION    . COLPOSCOPY W/ BIOPSY / CURETTAGE    . EXPLORATORY LAPAROTOMY    . MELANOMA EXCISION Right 12/25/2012   Procedure: wide EXCISION squamous cell carcinoma right chest ;  Surgeon: Zenovia Jarred, MD;  Location: [facility];  Service: General;  Laterality: Right;  . MOLE REMOVAL     has had 30 + removed not cancer  . TONSILLECTOMY    . TUBAL LIGATION      OB History    Gravida Para Term Preterm AB Living   _0 SAB TAB Ectopic Multiple Live Births           2       Home Medications    Prior to Admission medications   Medication Sig Start Date End Date Taking? Authorizing Provider  Cholecalciferol 5000 units capsule Take 1 capsule (5,000 Units total) by mouth daily. 09/09/15  Yes Estill Dooms, NP  escitalopram (LEXAPRO) 10 MG tablet Take 10 mg by mouth daily.   Yes Historical Provider, MD  lisinopril-hydrochlorothiazide (PRINZIDE,ZESTORETIC) 20-12.5 MG tablet TAKE 1 TABLET BY MOUTH EVERY DAY 10/09/15  Yes Estill Dooms, NP  ibuprofen (ADVIL,MOTRIN) 600 MG tablet Take 1 tablet (600 mg total) by mouth every 6 (six) hours as needed. 09/08/14   Evalee Jefferson, PA-C  Na Sulfate-K Sulfate-Mg Sulf (SUPREP BOWEL PREP KIT) 17.5-3.13-1.6 GM/180ML SOLN Take  1 kit by mouth as directed. 12/11/15   Danie Binder, MD  polyethylene glycol-electrolytes (TRILYTE) 420 g solution Take 4,000 mLs by mouth as directed. 12/11/15   Danie Binder, MD  sulfamethoxazole-trimethoprim (BACTRIM DS,SEPTRA DS) 800-160 MG tablet Take 1 tablet by mouth 2 (two) times daily. Patient not taking: Reported on 11/25/2015 10/03/15   Estill Dooms, NP  valACYclovir (VALTREX) 1000 MG tablet Take 1 tablet (1,000 mg total) by mouth daily. Patient not taking: Reported on 11/25/2015 08/05/15   Estill Dooms, NP    Family History Family History  Problem Relation Age of Onset  . Cancer Father     colon and lung  . COPD Father   . Atrial fibrillation Mother   . Diabetes Mother    . Stroke Sister   . Heart attack Brother   . Other Daughter     benign pituitary tumor    Social History Social History  Substance Use Topics  . Smoking status: Never Smoker  . Smokeless tobacco: Never Used  . Alcohol use Yes     Comment: occ.  employed   Allergies   Erythromycin   Review of Systems Review of Systems  All other systems reviewed and are negative.    Physical Exam Updated Vital Signs BP 122/66   Pulse 72   Temp 98.2 F (36.8 C) (Oral)   Resp 17   Ht _0  (1.6 m)   Wt 158 lb (71.7 kg)   SpO2 99%   BMI 27.99 kg/m   Vital signs normal    Physical Exam  Constitutional: She is oriented to person, place, and time. She appears well-developed and well-nourished.  Non-toxic appearance. She does not appear ill. No distress.  HENT:  Head: Normocephalic and atraumatic.  Right Ear: External ear normal.  Left Ear: External ear normal.  Nose: Nose normal. No mucosal edema or rhinorrhea.  Mouth/Throat: Oropharynx is clear and moist and mucous membranes are normal. No dental abscesses or uvula swelling.  Eyes: Conjunctivae and EOM are normal. Pupils are equal, round, and reactive to light.  Neck: Normal range of motion and full passive range of motion without pain. Neck supple.  Cardiovascular: Normal rate, regular rhythm and normal heart sounds.  Exam reveals no gallop and no friction rub.   No murmur heard. Pulmonary/Chest: Effort normal and breath sounds normal. No respiratory distress. She has no wheezes. She has no rhonchi. She has no rales. She exhibits no tenderness and no crepitus.  Abdominal: Soft. Normal appearance and bowel sounds are normal. She exhibits no distension. There is no tenderness. There is no rebound and no guarding.    Tender in the left lower quadrant  Musculoskeletal: Normal range of motion. She exhibits no edema or tenderness.  Moves all extremities well.   Neurological: She is alert and oriented to person, place, and time.  She has normal strength. No cranial nerve deficit.  Skin: Skin is warm, dry and intact. No rash noted. No erythema. No pallor.  Psychiatric: She has a normal mood and affect. Her speech is normal and behavior is normal. Her mood appears not anxious.  Nursing note and vitals reviewed.    ED Treatments / Results  Labs (all labs ordered are listed, but only abnormal results are displayed) Results for orders placed or performed during the hospital encounter of 02/26/16  CBC with Differential  Result Value Ref Range   WBC 11.5 (H) 4.0 - 10.5 K/uL   RBC 4.86 3.87 - 5.11  MIL/uL   Hemoglobin 13.8 12.0 - 15.0 g/dL   HCT 41.4 36.0 - 46.0 %   MCV 85.2 78.0 - 100.0 fL   MCH 28.4 26.0 - 34.0 pg   MCHC 33.3 30.0 - 36.0 g/dL   RDW 13.2 11.5 - 15.5 %   Platelets 302 150 - 400 K/uL   Neutrophils Relative % 71 %   Neutro Abs 8.2 (H) 1.7 - 7.7 K/uL   Lymphocytes Relative 18 %   Lymphs Abs 2.0 0.7 - 4.0 K/uL   Monocytes Relative 9 %   Monocytes Absolute 1.0 0.1 - 1.0 K/uL   Eosinophils Relative 2 %   Eosinophils Absolute 0.2 0.0 - 0.7 K/uL   Basophils Relative 0 %   Basophils Absolute 0.0 0.0 - 0.1 K/uL  Comprehensive metabolic panel  Result Value Ref Range   Sodium 138 135 - 145 mmol/L   Potassium 4.0 3.5 - 5.1 mmol/L   Chloride 102 101 - 111 mmol/L   CO2 28 22 - 32 mmol/L   Glucose, Bld 116 (H) 65 - 99 mg/dL   BUN 23 (H) 6 - 20 mg/dL   Creatinine, Ser 0.84 0.44 - 1.00 mg/dL   Calcium 9.3 8.9 - 10.3 mg/dL   Total Protein 7.3 6.5 - 8.1 g/dL   Albumin 4.3 3.5 - 5.0 g/dL   AST 15 15 - 41 U/L   ALT 14 14 - 54 U/L   Alkaline Phosphatase 66 38 - 126 U/L   Total Bilirubin 0.4 0.3 - 1.2 mg/dL   GFR calc non Af Amer >60 >60 mL/min   GFR calc Af Amer >60 >60 mL/min   Anion gap 8 5 - 15  Urinalysis, Routine w reflex microscopic (not at [facility])  Result Value Ref Range   Color, Urine YELLOW YELLOW   APPearance CLEAR CLEAR   Specific Gravity, Urine 1.025 1.005 - 1.030   pH 5.5 5.0 - 8.0    Glucose, UA NEGATIVE NEGATIVE mg/dL   Hgb urine dipstick TRACE (A) NEGATIVE   Bilirubin Urine NEGATIVE NEGATIVE   Ketones, ur NEGATIVE NEGATIVE mg/dL   Protein, ur NEGATIVE NEGATIVE mg/dL   Nitrite NEGATIVE NEGATIVE   Leukocytes, UA NEGATIVE NEGATIVE  Lipase, blood  Result Value Ref Range   Lipase 25 11 - 51 U/L  Urine microscopic-add on  Result Value Ref Range   Squamous Epithelial / LPF 6-30 (A) NONE SEEN   WBC, UA 0-5 0 - 5 WBC/hpf   RBC / HPF 0-5 0 - 5 RBC/hpf   Bacteria, UA MANY (A) NONE SEEN   Urine-Other MUCOUS PRESENT    Laboratory interpretation all normal except Except for leukocytosis and elevation of BUN consistent with dehydration    EKG  EKG Interpretation None       Radiology Ct Abdomen Pelvis W Contrast  Result Date: 02/26/2016 CLINICAL DATA:  Left lower quadrant pain. History of diverticulosis. EXAM: CT ABDOMEN AND PELVIS WITH CONTRAST TECHNIQUE: Multidetector CT imaging of the abdomen and pelvis was performed using the standard protocol following bolus administration of intravenous contrast. CONTRAST:  13m ISOVUE-300 IOPAMIDOL (ISOVUE-300) INJECTION 61%, COMPARISON:  None. FINDINGS: Lower chest: No pulmonary nodules. No visible pleural or pericardial effusion. Hepatobiliary: Normal hepatic size and contours without focal liver lesion. No perihepatic ascites. No intra- or extrahepatic biliary dilatation. Normal gallbladder. Pancreas: Normal pancreatic contours and enhancement. No peripancreatic fluid collection or pancreatic ductal dilatation. Spleen: Normal. Adrenals/Urinary Tract: Normal adrenal glands. No hydronephrosis or solid renal mass. Stomach/Bowel: There is rectosigmoid diverticulosis  with inflammatory fat infiltration surrounding the mid sigmoid colon. No evidence of perforation or abscess formation. No intra-abdominal fluid collection. No dilated small bowel. Normal appendix. Vascular/Lymphatic: Normal course and caliber of the major abdominal vessels.  No abdominal or pelvic adenopathy. Reproductive: Normal uterus and ovaries. No free fluid in the pelvis. Musculoskeletal: No lytic or blastic osseous lesion. Normal visualized extrathoracic and extraperitoneal soft tissues. Other: No contributory non-categorized findings. IMPRESSION: Acute sigmoid diverticulitis without evidence of perforation or abscess formation. Electronically Signed   By: Ulyses Jarred M.D.   On: 02/26/2016 05:26    Procedures Procedures (including critical care time)  Medications Ordered in ED Medications  fentaNYL (SUBLIMAZE) injection 50 mcg (not administered)  ondansetron (ZOFRAN) injection 4 mg (not administered)  sodium chloride 0.9 % bolus 1,000 mL (0 mLs Intravenous Stopped 02/26/16 0503)  sodium chloride 0.9 % bolus 500 mL (0 mLs Intravenous Stopped 02/26/16 0503)  ondansetron (ZOFRAN) injection 4 mg (4 mg Intravenous Given 02/26/16 0311)  fentaNYL (SUBLIMAZE) injection 50 mcg (50 mcg Intravenous Given 02/26/16 0311)  iopamidol (ISOVUE-300) 61 % injection (30 mLs  Contrast Given 02/26/16 0414)  iopamidol (ISOVUE-300) 61 % injection 100 mL (100 mLs Intravenous Contrast Given 02/26/16 0413)  Ampicillin-Sulbactam (UNASYN) 3 g in sodium chloride 0.9 % 100 mL IVPB (0 g Intravenous Stopped 02/26/16 0629)     Initial Impression / Assessment and Plan / ED Course  I have reviewed the triage vital signs and the nursing notes.  Pertinent labs & imaging results that were available during my care of the patient were reviewed by me and considered in my medical decision making (see chart for details).  Clinical Course    We discussed that the location of her pain and with an prior known history of diverticulosis, and with the eating of a lot of not she most likely has diverticulitis. CT scan was ordered.  After reviewing her CT scan she was started on Unasyn for acute sigmoid diverticulitis. We discussed her test results. Patient prefers to be admitted to make sure she is  going to do well.  6:24 AM Dr. Shanon Brow, hospitalist admit to observation med surgery  Final Clinical Impressions(s) / ED Diagnoses   Final diagnoses:  Acute diverticulitis   Plan admission  Rolland Porter, MD, Barbette Or, MD 02/26/16 [phone]

## 2016-02-26 NOTE — Care Management Note (Signed)
Case Management Note  Patient Details  Name: Shannon Lindsey MRN: [id number] Date of Birth: December 01, 1965  Subjective/Objective:                  Pt admitted with acute diverticulitis. Chart reviewed for CM needs. Pt is from home, lives alone and is ind with ADL's. She has PCP, transportation and no difficulty affording or managing medications.   Action/Plan: Plan for return home with self care. No CM needs anticipated.   Expected Discharge Date:    02/27/2016              Expected Discharge Plan:  Home/Self Care  In-House Referral:  NA  Discharge planning Services  CM Consult  Post Acute Care Choice:  NA Choice offered to:  NA  Status of Service:  Completed, signed off  Sherald Barge, RN 02/26/2016, 12:07 PM

## 2016-02-26 NOTE — H&P (Signed)
History and Physical    HELMA ARGYLE OAC:[id number] DOB: 06-Oct-1965 DOA: 02/26/2016  PCP: Robert Bellow, MD   Patient coming from: Home  Chief Complaint: Abdominal pain  HPI: Shannon Lindsey is a 50 y.o. female with medical history significant of HTN, diverticulosis, and vitamin D deficiency presents for evaluation of burning LLQ abdominal pain that began five days ago and worsened last night. Patient also reports diarrhea and nausea. Patient denies hematochezia, vomiting, fever, and dysuria. Patient states that she ate a significant amount of walnuts on Thanksgiving. Symptoms began approximately 6-7 days ago and has persistently gotten worse. Pain is described as a burning type pain in the left lower quadrant which radiates around her abdomen and through to her back.  ED Course: While in the ED, CT abd/pelvis was remarkable for acute sigmoid diverticulitis without evidence of perforation or abscess formation. Patient was started on Unasyn. Lab results revelead a mildly elevated WBC of 11,500. Patient was admitted to observation for acute diverticulitis.  Review of Systems: As per HPI otherwise 10 point review of systems negative.   Past Medical History:  Diagnosis Date  . Abnormal Pap smear   . Burning with urination 09/29/2015  . Cancer (HCC)    squamous cell chest  . Carpal tunnel syndrome, bilateral 09/29/2015  . H/O hiatal hernia   . HSV-1 (herpes simplex virus 1) infection   . HSV-2 (herpes simplex virus 2) infection   . Hypertension   . Vaginal dryness 07/17/2014  . Vaginal Pap smear, abnormal   . Vitamin D deficiency 09/09/2015  . Warts, genital     Past Surgical History:  Procedure Laterality Date  . CESAREAN SECTION    . COLPOSCOPY W/ BIOPSY / CURETTAGE    . EXPLORATORY LAPAROTOMY    . MELANOMA EXCISION Right 12/25/2012   Procedure: wide EXCISION squamous cell carcinoma right chest ;  Surgeon: Zenovia Jarred, MD;  Location: [facility];  Service: General;  Laterality:  Right;  . MOLE REMOVAL     has had 30 + removed not cancer  . TONSILLECTOMY    . TUBAL LIGATION       reports that she has never smoked. She has never used smokeless tobacco. She reports that she drinks alcohol. She reports that she does not use drugs.  Allergies  Allergen Reactions  . Erythromycin Other (See Comments)    Gets a UTI every time.    Family History  Problem Relation Age of Onset  . Cancer Father     colon and lung  . COPD Father   . Atrial fibrillation Mother   . Diabetes Mother   . Stroke Sister   . Heart attack Brother   . Other Daughter     benign pituitary tumor     Prior to Admission medications   Medication Sig Start Date End Date Taking? Authorizing Provider  Cholecalciferol 5000 units capsule Take 1 capsule (5,000 Units total) by mouth daily. 09/09/15  Yes Estill Dooms, NP  escitalopram (LEXAPRO) 10 MG tablet Take 10 mg by mouth daily.   Yes Historical Provider, MD  ibuprofen (ADVIL,MOTRIN) 600 MG tablet Take 1 tablet (600 mg total) by mouth every 6 (six) hours as needed. 09/08/14  Yes Evalee Jefferson, PA-C  lisinopril-hydrochlorothiazide (PRINZIDE,ZESTORETIC) 20-12.5 MG tablet TAKE 1 TABLET BY MOUTH EVERY DAY 10/09/15  Yes Estill Dooms, NP  valACYclovir (VALTREX) 1000 MG tablet Take 1 tablet (1,000 mg total) by mouth daily. 08/05/15  Yes Estill Dooms, NP  Na Sulfate-K Sulfate-Mg Sulf (SUPREP BOWEL PREP KIT) 17.5-3.13-1.6 GM/180ML SOLN Take 1 kit by mouth as directed. 12/11/15   Danie Binder, MD  polyethylene glycol-electrolytes (TRILYTE) 420 g solution Take 4,000 mLs by mouth as directed. 12/11/15   Danie Binder, MD  sulfamethoxazole-trimethoprim (BACTRIM DS,SEPTRA DS) 800-160 MG tablet Take 1 tablet by mouth 2 (two) times daily. Patient not taking: Reported on 11/25/2015 10/03/15   Estill Dooms, NP    Physical Exam: Vitals:   02/26/16 0600 02/26/16 0630 02/26/16 0730 02/26/16 1141  BP: 113/65 (!) 113/52 107/66 (!) 118/56  Pulse: 71  (!) 59 65 70  Resp: '17 17  18  ' Temp:    98.9 F (37.2 C)  TempSrc:    Oral  SpO2: 99% 98% 99% 97%  Weight:    74.2 kg (163 lb 8 oz)  Height:    '5\' 3"'  (1.6 m)      Constitutional: NAD, calm, comfortable Vitals:   02/26/16 0600 02/26/16 0630 02/26/16 0730 02/26/16 1141  BP: 113/65 (!) 113/52 107/66 (!) 118/56  Pulse: 71 (!) 59 65 70  Resp: '17 17  18  ' Temp:    98.9 F (37.2 C)  TempSrc:    Oral  SpO2: 99% 98% 99% 97%  Weight:    74.2 kg (163 lb 8 oz)  Height:    '5\' 3"'  (1.6 m)   Eyes: PERRL, lids and conjunctivae normal ENMT: Mucous membranes are moist. Posterior pharynx clear of any exudate or lesions.Normal dentition.  Neck: normal, supple, no masses, no thyromegaly Respiratory: clear to auscultation bilaterally, no wheezing, no crackles. Normal respiratory effort. No accessory muscle use.  Cardiovascular: Regular rate and rhythm, no murmurs / rubs / gallops. No extremity edema. 2+ pedal pulses. No carotid bruits.  Abdomen: Mild tenderness in the left lower quadrant, no masses palpated. No hepatosplenomegaly. Bowel sounds positive.  Musculoskeletal: no clubbing / cyanosis. No joint deformity upper and lower extremities. Good ROM, no contractures. Normal muscle tone.  Skin: no rashes, lesions, ulcers. No induration Neurologic: CN 2-12 grossly intact. Sensation intact, DTR normal. Strength 5/5 in all 4.  Psychiatric: Normal judgment and insight. Alert and oriented x 3. Normal mood.     Labs on Admission: I have personally reviewed following labs and imaging studies  CBC:  Recent Labs Lab 02/26/16 0121  WBC 11.5*  NEUTROABS 8.2*  HGB 13.8  HCT 41.4  MCV 85.2  PLT 818   Basic Metabolic Panel:  Recent Labs Lab 02/26/16 0121  NA 138  K 4.0  CL 102  CO2 28  GLUCOSE 116*  BUN 23*  CREATININE 0.84  CALCIUM 9.3   GFR: Estimated Creatinine Clearance: 75.6 mL/min (by C-G formula based on SCr of 0.84 mg/dL). Liver Function Tests:  Recent Labs Lab 02/26/16 0121    AST 15  ALT 14  ALKPHOS 66  BILITOT 0.4  PROT 7.3  ALBUMIN 4.3    Recent Labs Lab 02/26/16 0121  LIPASE 25   No results for input(s): AMMONIA in the last 168 hours. Coagulation Profile: No results for input(s): INR, PROTIME in the last 168 hours. Cardiac Enzymes: No results for input(s): CKTOTAL, CKMB, CKMBINDEX, TROPONINI in the last 168 hours. BNP (last 3 results) No results for input(s): PROBNP in the last 8760 hours. HbA1C: No results for input(s): HGBA1C in the last 72 hours. CBG: No results for input(s): GLUCAP in the last 168 hours. Lipid Profile: No results for input(s): CHOL, HDL, LDLCALC, TRIG, CHOLHDL, LDLDIRECT in the  last 72 hours. Thyroid Function Tests: No results for input(s): TSH, T4TOTAL, FREET4, T3FREE, THYROIDAB in the last 72 hours. Anemia Panel: No results for input(s): VITAMINB12, FOLATE, FERRITIN, TIBC, IRON, RETICCTPCT in the last 72 hours. Urine analysis:    Component Value Date/Time   COLORURINE YELLOW 02/26/2016 0245   APPEARANCEUR CLEAR 02/26/2016 0245   APPEARANCEUR Clear 09/29/2015 1345   LABSPEC 1.025 02/26/2016 0245   PHURINE 5.5 02/26/2016 0245   GLUCOSEU NEGATIVE 02/26/2016 0245   HGBUR TRACE (A) 02/26/2016 0245   BILIRUBINUR NEGATIVE 02/26/2016 0245   BILIRUBINUR Negative 09/29/2015 1345   KETONESUR NEGATIVE 02/26/2016 0245   PROTEINUR NEGATIVE 02/26/2016 0245   NITRITE NEGATIVE 02/26/2016 0245   LEUKOCYTESUR NEGATIVE 02/26/2016 0245   LEUKOCYTESUR 1+ (A) 09/29/2015 1345   Sepsis Labs: !!!!!!!!!!!!!!!!!!!!!!!!!!!!!!!!!!!!!!!!!!!! '@LABRCNTIP' (procalcitonin:4,lacticidven:4) )No results found for this or any previous visit (from the past 240 hour(s)).   Radiological Exams on Admission: Ct Abdomen Pelvis W Contrast  Result Date: 02/26/2016 CLINICAL DATA:  Left lower quadrant pain. History of diverticulosis. EXAM: CT ABDOMEN AND PELVIS WITH CONTRAST TECHNIQUE: Multidetector CT imaging of the abdomen and pelvis was performed using  the standard protocol following bolus administration of intravenous contrast. CONTRAST:  118m ISOVUE-300 IOPAMIDOL (ISOVUE-300) INJECTION 61%, COMPARISON:  None. FINDINGS: Lower chest: No pulmonary nodules. No visible pleural or pericardial effusion. Hepatobiliary: Normal hepatic size and contours without focal liver lesion. No perihepatic ascites. No intra- or extrahepatic biliary dilatation. Normal gallbladder. Pancreas: Normal pancreatic contours and enhancement. No peripancreatic fluid collection or pancreatic ductal dilatation. Spleen: Normal. Adrenals/Urinary Tract: Normal adrenal glands. No hydronephrosis or solid renal mass. Stomach/Bowel: There is rectosigmoid diverticulosis with inflammatory fat infiltration surrounding the mid sigmoid colon. No evidence of perforation or abscess formation. No intra-abdominal fluid collection. No dilated small bowel. Normal appendix. Vascular/Lymphatic: Normal course and caliber of the major abdominal vessels. No abdominal or pelvic adenopathy. Reproductive: Normal uterus and ovaries. No free fluid in the pelvis. Musculoskeletal: No lytic or blastic osseous lesion. Normal visualized extrathoracic and extraperitoneal soft tissues. Other: No contributory non-categorized findings. IMPRESSION: Acute sigmoid diverticulitis without evidence of perforation or abscess formation. Electronically Signed   By: KUlyses JarredM.D.   On: 02/26/2016 05:26    EKG: Independently reviewed.  Assessment/Plan Active Problems:   HTN (hypertension)   Acute diverticulitis   Abdominal pain  1. Acute diverticulitis. Appears uncomplicated. Continue the patient on intravenous antibiotics with ciprofloxacin and Flagyl. Provide antiemetics and pain medications. Advance diet to low fiber. Anticipate discharge home tomorrow if she continues to improve. 2. HTN. On arrival, patient's pressures were elevated but are now stable. Continue home regimen of lisinopril and HCTZ. Continue to  monitor.   DVT prophylaxis: Lovenox Code Status: Full Family Communication: Daughter at bedside Disposition Plan: Discharge home once improved Consults called: None Admission status: Observation   JKathie Dike MD [facility] Pager [phone] If 7PM-7AM, please contact night-coverage [email or web address] Password [email or web address]  02/26/2016, 11:52 AM

## 2016-02-27 DIAGNOSIS — R1032 Left lower quadrant pain: Secondary | ICD-10-CM | POA: Diagnosis not present

## 2016-02-27 DIAGNOSIS — K5792 Diverticulitis of intestine, part unspecified, without perforation or abscess without bleeding: Secondary | ICD-10-CM | POA: Diagnosis not present

## 2016-02-27 DIAGNOSIS — I10 Essential (primary) hypertension: Secondary | ICD-10-CM | POA: Diagnosis not present

## 2016-02-27 LAB — CBC
HCT: 36.4 % (ref 36.0–46.0)
Hemoglobin: 11.8 g/dL — ABNORMAL LOW (ref 12.0–15.0)
MCH: 28.1 pg (ref 26.0–34.0)
MCHC: 32.4 g/dL (ref 30.0–36.0)
MCV: 86.7 fL (ref 78.0–100.0)
PLATELETS: 269 10*3/uL (ref 150–400)
RBC: 4.2 MIL/uL (ref 3.87–5.11)
RDW: 13.2 % (ref 11.5–15.5)
WBC: 4.3 10*3/uL (ref 4.0–10.5)

## 2016-02-27 LAB — BASIC METABOLIC PANEL
Anion gap: 3 — ABNORMAL LOW (ref 5–15)
BUN: 11 mg/dL (ref 6–20)
CALCIUM: 8.3 mg/dL — AB (ref 8.9–10.3)
CHLORIDE: 105 mmol/L (ref 101–111)
CO2: 29 mmol/L (ref 22–32)
CREATININE: 0.75 mg/dL (ref 0.44–1.00)
GFR calc non Af Amer: >60 mL/min (ref >60)
Glucose, Bld: 123 mg/dL — ABNORMAL HIGH (ref 65–99)
Potassium: 4.1 mmol/L (ref 3.5–5.1)
SODIUM: 137 mmol/L (ref 135–145)

## 2016-02-27 MED ORDER — METRONIDAZOLE 500 MG PO TABS: 500.0000 mg | ORAL_TABLET | Freq: Three times a day (TID) | ORAL | 0 refills | Status: DC

## 2016-02-27 MED ORDER — CIPROFLOXACIN HCL 500 MG PO TABS: 500.0000 mg | ORAL_TABLET | Freq: Two times a day (BID) | ORAL | 0 refills | Status: DC

## 2016-02-27 MED ORDER — HYDROCODONE-ACETAMINOPHEN 5-325 MG PO TABS: 1.0000 | ORAL_TABLET | ORAL | 0 refills | Status: DC | PRN

## 2016-02-27 NOTE — Progress Notes (Signed)
Discharged PT per MD order and protocol. Discharge handouts reviewed/explained. Education completed.  Pt verbalized understanding and left with all belongings. VSS. IV catheter D/C. Prescriptions given and explained.   Patient wheeled down by staff member. Oswald Hillock, RN

## 2016-02-27 NOTE — Discharge Summary (Signed)
Physician Discharge Summary  Shannon Lindsey:[id number] DOB: 05-05-1965 DOA: 02/26/2016  PCP: Robert Bellow, MD  Admit date: 02/26/2016 Discharge date: 02/27/2016  Admitted From: home Disposition:  home  Recommendations for Outpatient Follow-up:  1. Follow up with PCP in 1-2 weeks 2. Follow up with GI in 6 weeks to consider for colonoscopy.  Home Health: Equipment/Devices:  Discharge Condition:Stable CODE STATUS:Full code Diet recommendation: Heart Healthy   Brief/Interim Summary: Shannon Lindsey is a 50 y.o. female with medical history significant of HTN, diverticulosis, and vitamin D deficiency presents for evaluation of burning LLQ abdominal pain that began five days ago and worsened last night. Patient also reports diarrhea and nausea. Patient denies hematochezia, vomiting, fever, and dysuria. Patient states that she ate a significant amount of walnuts on Thanksgiving. Symptoms began approximately 6-7 days ago and has persistently gotten worse. Pain is described as a burning type pain in the left lower quadrant which radiates around her abdomen and through to her back.  Patient was monitored in the hospital overnight. She was started on intravenous antibiotics and diet was advanced. Her abdominal pain quickly improved and she is no longer requiring any further pain medications. She is tolerating her diet. She'll complete a total of 2 weeks of antibiotics and has been told to follow-up with her gastroenterologist. She says she is due for a colonoscopy. She's been advised to follow-up in the next 6 weeks with GI and in the next 2 weeks with her primary care physician. Patient is ready for discharge.  Discharge Diagnoses:  Active Problems:   HTN (hypertension)   Acute diverticulitis   Abdominal pain    Discharge Instructions  Discharge Instructions    Diet - low sodium heart healthy    Complete by:  As directed    Increase activity slowly    Complete by:  As directed         Medication List    STOP taking these medications   ibuprofen 600 MG tablet Commonly known as:  ADVIL,MOTRIN   Na Sulfate-K Sulfate-Mg Sulf 17.5-3.13-1.6 GM/180ML Soln Commonly known as:  SUPREP BOWEL PREP KIT   polyethylene glycol-electrolytes 420 g solution Commonly known as:  TRILYTE     TAKE these medications   Cholecalciferol 5000 units capsule Take 1 capsule (5,000 Units total) by mouth daily.   ciprofloxacin 500 MG tablet Commonly known as:  CIPRO Take 1 tablet (500 mg total) by mouth 2 (two) times daily.   escitalopram 10 MG tablet Commonly known as:  LEXAPRO Take 10 mg by mouth daily.   HYDROcodone-acetaminophen 5-325 MG tablet Commonly known as:  NORCO/VICODIN Take 1 tablet by mouth every 4 (four) hours as needed for moderate pain.   lisinopril-hydrochlorothiazide 20-12.5 MG tablet Commonly known as:  PRINZIDE,ZESTORETIC TAKE 1 TABLET BY MOUTH EVERY DAY   metroNIDAZOLE 500 MG tablet Commonly known as:  FLAGYL Take 1 tablet (500 mg total) by mouth 3 (three) times daily.   valACYclovir 1000 MG tablet Commonly known as:  VALTREX Take 1 tablet (1,000 mg total) by mouth daily.      Follow-up Information    Robert Bellow, MD. Schedule an appointment as soon as possible for a visit in 2 week(s).   Specialty:  Family Medicine Contact information: [address] [place] [address] [phone]          Allergies  Allergen Reactions  . Erythromycin Other (See Comments)    Gets a UTI every time.    Consultations:  Procedures/Studies: Ct Abdomen Pelvis W Contrast  Result Date: 02/26/2016 CLINICAL DATA:  Left lower quadrant pain. History of diverticulosis. EXAM: CT ABDOMEN AND PELVIS WITH CONTRAST TECHNIQUE: Multidetector CT imaging of the abdomen and pelvis was performed using the standard protocol following bolus administration of intravenous contrast. CONTRAST:  165m ISOVUE-300 IOPAMIDOL (ISOVUE-300) INJECTION 61%,  COMPARISON:  None. FINDINGS: Lower chest: No pulmonary nodules. No visible pleural or pericardial effusion. Hepatobiliary: Normal hepatic size and contours without focal liver lesion. No perihepatic ascites. No intra- or extrahepatic biliary dilatation. Normal gallbladder. Pancreas: Normal pancreatic contours and enhancement. No peripancreatic fluid collection or pancreatic ductal dilatation. Spleen: Normal. Adrenals/Urinary Tract: Normal adrenal glands. No hydronephrosis or solid renal mass. Stomach/Bowel: There is rectosigmoid diverticulosis with inflammatory fat infiltration surrounding the mid sigmoid colon. No evidence of perforation or abscess formation. No intra-abdominal fluid collection. No dilated small bowel. Normal appendix. Vascular/Lymphatic: Normal course and caliber of the major abdominal vessels. No abdominal or pelvic adenopathy. Reproductive: Normal uterus and ovaries. No free fluid in the pelvis. Musculoskeletal: No lytic or blastic osseous lesion. Normal visualized extrathoracic and extraperitoneal soft tissues. Other: No contributory non-categorized findings. IMPRESSION: Acute sigmoid diverticulitis without evidence of perforation or abscess formation. Electronically Signed   By: KUlyses JarredM.D.   On: 02/26/2016 05:26       Subjective: Abdominal pain improved. No vomiting.  Discharge Exam: Vitals:   02/26/16 2225 02/27/16 0514  BP: 115/61 114/63  Pulse: 72 69  Resp: 16 18  Temp: 98.4 F (36.9 C) 98.4 F (36.9 C)   Vitals:   02/26/16 1141 02/26/16 1426 02/26/16 2225 02/27/16 0514  BP: (!) 118/56 128/77 115/61 114/63  Pulse: 70 67 72 69  Resp: _0 Temp: 98.9 F (37.2 C)  98.4 F (36.9 C) 98.4 F (36.9 C)  TempSrc: Oral  Oral Oral  SpO2: 97% 100% 100% 98%  Weight: 74.2 kg (163 lb 8 oz)     Height: _1  (1.6 m)       General: Pt is alert, awake, not in acute distress Cardiovascular: RRR, S1/S2 +, no rubs, no gallops Respiratory: CTA bilaterally, no  wheezing, no rhonchi Abdominal: Soft, NT, ND, bowel sounds + Extremities: no edema, no cyanosis    The results of significant diagnostics from this hospitalization (including imaging, microbiology, ancillary and laboratory) are listed below for reference.     Microbiology: No results found for this or any previous visit (from the past 240 hour(s)).   Labs: BNP (last 3 results) No results for input(s): BNP in the last 8760 hours. Basic Metabolic Panel:  Recent Labs Lab 02/26/16 0121 02/27/16 0544  NA 138 137  K 4.0 4.1  CL 102 105  CO2 28 29  GLUCOSE 116* 123*  BUN 23* 11  CREATININE 0.84 0.75  CALCIUM 9.3 8.3*   Liver Function Tests:  Recent Labs Lab 02/26/16 0121  AST 15  ALT 14  ALKPHOS 66  BILITOT 0.4  PROT 7.3  ALBUMIN 4.3    Recent Labs Lab 02/26/16 0121  LIPASE 25   No results for input(s): AMMONIA in the last 168 hours. CBC:  Recent Labs Lab 02/26/16 0121 02/27/16 0544  WBC 11.5* 4.3  NEUTROABS 8.2*  --   HGB 13.8 11.8*  HCT 41.4 36.4  MCV 85.2 86.7  PLT 302 269   Cardiac Enzymes: No results for input(s): CKTOTAL, CKMB, CKMBINDEX, TROPONINI in the last 168 hours. BNP: Invalid input(s): POCBNP CBG: No results for input(s): GLUCAP in  the last 168 hours. D-Dimer No results for input(s): DDIMER in the last 72 hours. Hgb A1c No results for input(s): HGBA1C in the last 72 hours. Lipid Profile No results for input(s): CHOL, HDL, LDLCALC, TRIG, CHOLHDL, LDLDIRECT in the last 72 hours. Thyroid function studies No results for input(s): TSH, T4TOTAL, T3FREE, THYROIDAB in the last 72 hours.  Invalid input(s): FREET3 Anemia work up No results for input(s): VITAMINB12, FOLATE, FERRITIN, TIBC, IRON, RETICCTPCT in the last 72 hours. Urinalysis    Component Value Date/Time   COLORURINE YELLOW 02/26/2016 0245   APPEARANCEUR CLEAR 02/26/2016 0245   APPEARANCEUR Clear 09/29/2015 1345   LABSPEC 1.025 02/26/2016 0245   PHURINE 5.5 02/26/2016  0245   GLUCOSEU NEGATIVE 02/26/2016 0245   HGBUR TRACE (A) 02/26/2016 0245   BILIRUBINUR NEGATIVE 02/26/2016 0245   BILIRUBINUR Negative 09/29/2015 1345   KETONESUR NEGATIVE 02/26/2016 0245   PROTEINUR NEGATIVE 02/26/2016 0245   NITRITE NEGATIVE 02/26/2016 0245   LEUKOCYTESUR NEGATIVE 02/26/2016 0245   LEUKOCYTESUR 1+ (A) 09/29/2015 1345   Sepsis Labs Invalid input(s): PROCALCITONIN,  WBC,  LACTICIDVEN Microbiology No results found for this or any previous visit (from the past 240 hour(s)).   Time coordinating discharge: Over 30 minutes  SIGNED:   Kathie Dike, MD  [facility] 02/27/2016, 8:02 PM Pager   If 7PM-7AM, please contact night-coverage [email or web address] Password [email or web address]

## 2016-02-27 NOTE — Discharge Instructions (Signed)
Diverticulosis  Diverticulosis is the condition that develops when small pouches (diverticula) form in the wall of your colon. Your colon, or large intestine, is where water is absorbed and stool is formed. The pouches form when the inside layer of your colon pushes through weak spots in the outer layers of your colon.  CAUSES   No one knows exactly what causes diverticulosis.  RISK FACTORS  · Being older than 50. Your risk for this condition increases with age. Diverticulosis is rare in people younger than 40 years. By age 80, almost everyone has it.  · Eating a low-fiber diet.  · Being frequently constipated.  · Being overweight.  · Not getting enough exercise.  · Smoking.  · Taking over-the-counter pain medicines, like aspirin and ibuprofen.  SYMPTOMS   Most people with diverticulosis do not have symptoms.  DIAGNOSIS   Because diverticulosis often has no symptoms, health care providers often discover the condition during an exam for other colon problems. In many cases, a health care provider will diagnose diverticulosis while using a flexible scope to examine the colon (colonoscopy).  TREATMENT   If you have never developed an infection related to diverticulosis, you may not need treatment. If you have had an infection before, treatment may include:  · Eating more fruits, vegetables, and grains.  · Taking a fiber supplement.  · Taking a live bacteria supplement (probiotic).  · Taking medicine to relax your colon.  HOME CARE INSTRUCTIONS   · Drink at least 6-8 glasses of water each day to prevent constipation.  · Try not to strain when you have a bowel movement.  · Keep all follow-up appointments.  If you have had an infection before:   · Increase the fiber in your diet as directed by your health care provider or dietitian.  · Take a dietary fiber supplement if your health care provider approves.  · Only take medicines as directed by your health care provider.  SEEK MEDICAL CARE IF:   · You have abdominal pain.   · You have bloating.  · You have cramps.  · You have not gone to the bathroom in 3 days.  SEEK IMMEDIATE MEDICAL CARE IF:   · Your pain gets worse.  · Your bloating becomes very bad.  · You have a fever or chills, and your symptoms suddenly get worse.  · You begin vomiting.  · You have bowel movements that are bloody or black.  MAKE SURE YOU:  · Understand these instructions.  · Will watch your condition.  · Will get help right away if you are not doing well or get worse.     This information is not intended to replace advice given to you by your health care provider. Make sure you discuss any questions you have with your health care provider.     Document Released: 12/11/2003 Document Revised: 03/20/2013 Document Reviewed: 02/07/2013  Elsevier Interactive Patient Education ©2017 [employer]

## 2016-03-08 DIAGNOSIS — F419 Anxiety disorder, unspecified: Secondary | ICD-10-CM | POA: Diagnosis not present

## 2016-03-08 DIAGNOSIS — K5730 Diverticulosis of large intestine without perforation or abscess without bleeding: Secondary | ICD-10-CM | POA: Diagnosis not present

## 2016-03-08 DIAGNOSIS — G5603 Carpal tunnel syndrome, bilateral upper limbs: Secondary | ICD-10-CM | POA: Diagnosis not present

## 2016-05-06 ENCOUNTER — Telehealth: Payer: Self-pay | Admitting: Gastroenterology

## 2016-05-06 NOTE — Telephone Encounter (Signed)
Pt called to schedule her colonoscopy. She had been on the schedule back in 2016 because of indigestion but OV had been cancelled. She isn't having any GI problems now and wanted to schedule procedure. Please call [phone]

## 2016-05-17 NOTE — Telephone Encounter (Signed)
FYI to Leslie Lewis, PA.  

## 2016-05-17 NOTE — Telephone Encounter (Signed)
Pt would like to have colonoscopy. No GI problems at the present. Recent diverticulitis.  OV with Neil Crouch, PA on 06/07/2016 at 10:30 AM.

## 2016-06-07 ENCOUNTER — Ambulatory Visit (INDEPENDENT_AMBULATORY_CARE_PROVIDER_SITE_OTHER): Payer: BLUE CROSS/BLUE SHIELD | Admitting: Gastroenterology

## 2016-06-07 ENCOUNTER — Encounter: Payer: Self-pay | Admitting: Gastroenterology

## 2016-06-07 ENCOUNTER — Other Ambulatory Visit: Payer: Self-pay

## 2016-06-07 VITALS — BP 128/75 | HR 59 | Temp 98.0°F | Ht 63.0 in | Wt 157.0 lb

## 2016-06-07 DIAGNOSIS — Z800 Family history of malignant neoplasm of digestive organs: Secondary | ICD-10-CM

## 2016-06-07 DIAGNOSIS — K5792 Diverticulitis of intestine, part unspecified, without perforation or abscess without bleeding: Secondary | ICD-10-CM

## 2016-06-07 MED ORDER — SOD PICOSULFATE-MAG OX-CIT ACD 10-3.5-12 MG-GM-GM PO PACK: 1.0000 | PACK | ORAL | 0 refills | Status: DC

## 2016-06-07 NOTE — Patient Instructions (Signed)
1. Colonoscopy as scheduled. See separate instructions.  2. You will need a colonoscopy every 5 years based on current guidelines and family history of colon cancer with your father at an age less than 53 years old.

## 2016-06-07 NOTE — Progress Notes (Signed)
Primary Care Physician:  Robert Bellow, MD  Primary Gastroenterologist:  Barney Drain, MD   Chief Complaint  Patient presents with  . Colonoscopy    consult, had diverticulitis    HPI:  Shannon Lindsey is a 51 y.o. female here to schedule a colonoscopy.   Patient was seen in 01/2016 in ED with acute uncomplicated diverticulitis. Admitted for IV antibiotics. Since then she has changed her diet, eating more fiber. She generally has formed to hard stool in morning and subsequent soft stool later in the day. Since diverticulitis she has somewhat of incomplete sensation after BM. Occasional toilet tissue hematochezia "from wiping too much". No melena, no fever. appetite is good. Has had some intermittent soreness in LLQ right before BM and relieved after BM. Noticed this only since diverticulitis.   Father had colon cancer in his early 80s. Patient's last colonoscopy in 2011 as outlined below. Overdue for colonoscopy given family history.   No UGI symptoms.   Current Outpatient Prescriptions  Medication Sig Dispense Refill  . Cholecalciferol 5000 units capsule Take 1 capsule (5,000 Units total) by mouth daily.    Marland Kitchen escitalopram (LEXAPRO) 10 MG tablet Take 10 mg by mouth daily.    Marland Kitchen lisinopril-hydrochlorothiazide (PRINZIDE,ZESTORETIC) 20-12.5 MG tablet TAKE 1 TABLET BY MOUTH EVERY DAY 90 tablet 3  . OVER THE COUNTER MEDICATION Over the counter heartburn medication takes as needed (pt unsure of name of med)    . valACYclovir (VALTREX) 1000 MG tablet Take 1 tablet (1,000 mg total) by mouth daily. (Patient taking differently: Take 1,000 mg by mouth as needed. ) 30 tablet prn   No current facility-administered medications for this visit.     Allergies as of 06/07/2016 - Review Complete 06/07/2016  Allergen Reaction Noted  . Erythromycin Other (See Comments) 07/12/2012    Past Medical History:  Diagnosis Date  . Abnormal Pap smear   . Burning with urination 09/29/2015  . Cancer (HCC)     squamous cell chest  . Carpal tunnel syndrome, bilateral 09/29/2015  . H/O hiatal hernia   . HSV-1 (herpes simplex virus 1) infection   . HSV-2 (herpes simplex virus 2) infection   . Hypertension   . Vaginal dryness 07/17/2014  . Vaginal Pap smear, abnormal   . Vitamin D deficiency 09/09/2015  . Warts, genital     Past Surgical History:  Procedure Laterality Date  . CESAREAN SECTION    . COLONOSCOPY WITH ESOPHAGOGASTRODUODENOSCOPY (EGD)  2011   Dr. Oneida Alar: small hh, gastritis bx negative for h.pylori, diverticulosis of colon, two hyperplastic colon polyps removed. TCS in 5 years due to father having colon cancer at age 26  . COLPOSCOPY W/ BIOPSY / CURETTAGE    . EXPLORATORY LAPAROTOMY    . MELANOMA EXCISION Right 12/25/2012   Procedure: wide EXCISION squamous cell carcinoma right chest ;  Surgeon: Zenovia Jarred, MD;  Location: [place];  Service: General;  Laterality: Right;  . MOLE REMOVAL     has had 30 + removed not cancer  . TONSILLECTOMY    . TUBAL LIGATION      Family History  Problem Relation Age of Onset  . Cancer Father     colon and lung  . COPD Father   . Colon cancer Father 27  . Atrial fibrillation Mother   . Diabetes Mother   . Stroke Sister   . Heart attack Brother   . Other Daughter     benign pituitary tumor    Social  History   Social History  . Marital status: Married    Spouse name: N/A  . Number of children: N/A  . Years of education: N/A   Occupational History  . Not on file.   Social History Main Topics  . Smoking status: Never Smoker  . Smokeless tobacco: Never Used  . Alcohol use Yes     Comment: occ.  . Drug use: No  . Sexual activity: Not Currently    Birth control/ protection: Post-menopausal   Other Topics Concern  . Not on file   Social History Narrative  . No narrative on file      ROS:  General: Negative for anorexia, weight loss, fever, chills, fatigue, weakness. Eyes: Negative for vision changes.  ENT: Negative  for hoarseness, difficulty swallowing , nasal congestion. CV: Negative for chest pain, angina, palpitations, dyspnea on exertion, peripheral edema.  Respiratory: Negative for dyspnea at rest, dyspnea on exertion, cough, sputum, wheezing.  GI: See history of present illness. GU:  Negative for dysuria, hematuria, urinary incontinence, urinary frequency, nocturnal urination.  MS: Negative for joint pain, low back pain.  Derm: Negative for rash or itching.  Neuro: Negative for weakness, abnormal sensation, seizure, frequent headaches, memory loss, confusion.  Psych: Negative for anxiety, depression, suicidal ideation, hallucinations.  Endo: Negative for unusual weight change.  Heme: Negative for bruising or bleeding. Allergy: Negative for rash or hives.    Physical Examination:  BP 128/75   Pulse (!) 59   Temp 98 F (36.7 C) (Oral)   Ht 5\' 3"  (1.6 m)   Wt 157 lb (71.2 kg)   BMI 27.81 kg/m    General: Well-nourished, well-developed in no acute distress.  Head: Normocephalic, atraumatic.   Eyes: Conjunctiva pink, no icterus. Mouth: Oropharyngeal mucosa moist and pink , no lesions erythema or exudate. Neck: Supple without thyromegaly, masses, or lymphadenopathy.  Lungs: Clear to auscultation bilaterally.  Heart: Regular rate and rhythm, no murmurs rubs or gallops.  Abdomen: Bowel sounds are normal, nontender, nondistended, no hepatosplenomegaly or masses, no abdominal bruits or    hernia , no rebound or guarding.   Rectal: deferred Extremities: No lower extremity edema. No clubbing or deformities.  Neuro: Alert and oriented x 4 , grossly normal neurologically.  Skin: Warm and dry, no rash or jaundice.   Psych: Alert and cooperative, normal mood and affect.  Labs: Lab Results  Component Value Date   ALT 14 02/26/2016   AST 15 02/26/2016   ALKPHOS 66 02/26/2016   BILITOT 0.4 02/26/2016   Lab Results  Component Value Date   CREATININE 0.75 02/27/2016   BUN 11 02/27/2016   NA  137 02/27/2016   K 4.1 02/27/2016   CL 105 02/27/2016   CO2 29 02/27/2016   Lab Results  Component Value Date   WBC 4.3 02/27/2016   HGB 11.8 (L) 02/27/2016   HCT 36.4 02/27/2016   MCV 86.7 02/27/2016   PLT 269 02/27/2016     Imaging Studies: No results found.

## 2016-06-07 NOTE — Assessment & Plan Note (Signed)
51 y/o female with recent diverticulitis with hospitalization, 1st episode, who presents to schedule high risk screening colonoscopy for FH CRC in her father at age 71. She has some intermittent LLQ discomfort which improves after BM but benign abdominal exam. Plan for colonoscopy in near future.  I have discussed the risks, alternatives, benefits with regards to but not limited to the risk of reaction to medication, bleeding, infection, perforation and the patient is agreeable to proceed. Written consent to be obtained.  Augment conscious sedation with phenergan 25mg  IV 45 minutes before the procedure.

## 2016-06-08 NOTE — Progress Notes (Signed)
cc'ed to pcp °

## 2016-06-09 ENCOUNTER — Other Ambulatory Visit: Payer: Self-pay

## 2016-06-29 ENCOUNTER — Telehealth: Payer: Self-pay | Admitting: General Practice

## 2016-06-29 NOTE — Telephone Encounter (Signed)
I called the patient to reschedule her procedure from 4/16 to 4/9, to arrive ar 8:30 am for a 9:30 am procedure.  I was unable to reach the patient, lmom

## 2016-07-01 ENCOUNTER — Encounter: Payer: Self-pay | Admitting: General Practice

## 2016-07-01 NOTE — Telephone Encounter (Signed)
Instructions placed in the mail to the patient

## 2016-07-01 NOTE — Telephone Encounter (Signed)
I spoke with the patient and she is unable to come in on 4/9, however she can have her procedure 4/23 at 8:30 with an 7:30 procedure arrival time.  I will print out new prep instruction for her to pick up

## 2016-07-14 NOTE — Progress Notes (Signed)
REVIEWED-NO ADDITIONAL RECOMMENDATIONS. 

## 2016-07-19 ENCOUNTER — Encounter (HOSPITAL_COMMUNITY): Payer: Self-pay | Admitting: *Deleted

## 2016-07-19 ENCOUNTER — Ambulatory Visit (HOSPITAL_COMMUNITY)
Admission: RE | Admit: 2016-07-19 | Discharge: 2016-07-19 | Disposition: A | Discharge status: Home / Self Care | Payer: BLUE CROSS/BLUE SHIELD | Source: Ambulatory Visit | Attending: Gastroenterology | Admitting: Gastroenterology

## 2016-07-19 ENCOUNTER — Encounter (HOSPITAL_COMMUNITY)
Admission: RE | Disposition: A | Discharge status: Home / Self Care | Payer: Self-pay | Source: Ambulatory Visit | Attending: Gastroenterology

## 2016-07-19 DIAGNOSIS — Z8249 Family history of ischemic heart disease and other diseases of the circulatory system: Secondary | ICD-10-CM | POA: Insufficient documentation

## 2016-07-19 DIAGNOSIS — K648 Other hemorrhoids: Secondary | ICD-10-CM | POA: Insufficient documentation

## 2016-07-19 DIAGNOSIS — Z79899 Other long term (current) drug therapy: Secondary | ICD-10-CM | POA: Diagnosis not present

## 2016-07-19 DIAGNOSIS — Z833 Family history of diabetes mellitus: Secondary | ICD-10-CM | POA: Insufficient documentation

## 2016-07-19 DIAGNOSIS — B009 Herpesviral infection, unspecified: Secondary | ICD-10-CM | POA: Insufficient documentation

## 2016-07-19 DIAGNOSIS — Z86010 Personal history of colonic polyps: Secondary | ICD-10-CM | POA: Diagnosis not present

## 2016-07-19 DIAGNOSIS — Z8371 Family history of colonic polyps: Secondary | ICD-10-CM | POA: Diagnosis not present

## 2016-07-19 DIAGNOSIS — I10 Essential (primary) hypertension: Secondary | ICD-10-CM | POA: Diagnosis not present

## 2016-07-19 DIAGNOSIS — K5730 Diverticulosis of large intestine without perforation or abscess without bleeding: Secondary | ICD-10-CM | POA: Diagnosis not present

## 2016-07-19 DIAGNOSIS — Z800 Family history of malignant neoplasm of digestive organs: Secondary | ICD-10-CM | POA: Insufficient documentation

## 2016-07-19 DIAGNOSIS — Z85828 Personal history of other malignant neoplasm of skin: Secondary | ICD-10-CM | POA: Diagnosis not present

## 2016-07-19 DIAGNOSIS — Z1211 Encounter for screening for malignant neoplasm of colon: Secondary | ICD-10-CM | POA: Diagnosis not present

## 2016-07-19 DIAGNOSIS — E559 Vitamin D deficiency, unspecified: Secondary | ICD-10-CM | POA: Diagnosis not present

## 2016-07-19 DIAGNOSIS — K635 Polyp of colon: Secondary | ICD-10-CM | POA: Diagnosis not present

## 2016-07-19 HISTORY — PX: COLONOSCOPY: SHX5424

## 2016-07-19 SURGERY — COLONOSCOPY
Anesthesia: Moderate Sedation

## 2016-07-19 MED ORDER — MEPERIDINE HCL 100 MG/ML IJ SOLN
INTRAMUSCULAR | Status: AC
  Filled 2016-07-19: qty 2

## 2016-07-19 MED ORDER — PROMETHAZINE HCL 25 MG/ML IJ SOLN
25.0000 mg | Freq: Once | INTRAMUSCULAR | Status: AC
  Administered 2016-07-19: 25 mg via INTRAVENOUS

## 2016-07-19 MED ORDER — MIDAZOLAM HCL 5 MG/5ML IJ SOLN
INTRAMUSCULAR | Status: DC | PRN
  Administered 2016-07-19 (×2): 2 mg via INTRAVENOUS

## 2016-07-19 MED ORDER — MIDAZOLAM HCL 5 MG/5ML IJ SOLN
INTRAMUSCULAR | Status: AC
  Filled 2016-07-19: qty 10

## 2016-07-19 MED ORDER — SODIUM CHLORIDE 0.9% FLUSH
INTRAVENOUS | Status: AC
  Filled 2016-07-19: qty 10

## 2016-07-19 MED ORDER — PROMETHAZINE HCL 25 MG/ML IJ SOLN
INTRAMUSCULAR | Status: AC
  Filled 2016-07-19: qty 1

## 2016-07-19 MED ORDER — SODIUM CHLORIDE 0.9 % IV SOLN
INTRAVENOUS | Status: DC
  Administered 2016-07-19: 08:00:00 via INTRAVENOUS

## 2016-07-19 MED ORDER — MEPERIDINE HCL 100 MG/ML IJ SOLN
INTRAMUSCULAR | Status: DC | PRN
  Administered 2016-07-19: 50 mg via INTRAVENOUS
  Administered 2016-07-19: 25 mg via INTRAVENOUS

## 2016-07-19 NOTE — H&P (Signed)
Primary Care Physician:  Robert Bellow, MD Primary Gastroenterologist:  Dr. Oneida Alar  Pre-Procedure History & Physical: HPI:  Shannon Lindsey is a 51 y.o. female here for  FAMILY Hx COLON CA-FATHER HAD COLON CA AGE < 47.  Past Medical History:  Diagnosis Date  . Abnormal Pap smear   . Burning with urination 09/29/2015  . Cancer (HCC)    squamous cell chest  . Carpal tunnel syndrome, bilateral 09/29/2015  . H/O hiatal hernia   . HSV-1 (herpes simplex virus 1) infection   . HSV-2 (herpes simplex virus 2) infection   . Hypertension   . Vaginal dryness 07/17/2014  . Vaginal Pap smear, abnormal   . Vitamin D deficiency 09/09/2015  . Warts, genital     Past Surgical History:  Procedure Laterality Date  . CESAREAN SECTION    . COLONOSCOPY WITH ESOPHAGOGASTRODUODENOSCOPY (EGD)  2011   Dr. Oneida Alar: small hh, gastritis bx negative for h.pylori, diverticulosis of colon, two hyperplastic colon polyps removed. TCS in 5 years due to father having colon cancer at age 35  . COLPOSCOPY W/ BIOPSY / CURETTAGE    . EXPLORATORY LAPAROTOMY    . MELANOMA EXCISION Right 12/25/2012   Procedure: wide EXCISION squamous cell carcinoma right chest ;  Surgeon: Zenovia Jarred, MD;  Location: [facility];  Service: General;  Laterality: Right;  . MOLE REMOVAL     has had 30 + removed not cancer  . TONSILLECTOMY    . TUBAL LIGATION      Prior to Admission medications   Medication Sig Start Date End Date Taking? Authorizing Provider  Cholecalciferol 5000 units capsule Take 1 capsule (5,000 Units total) by mouth daily. 09/09/15  Yes Estill Dooms, NP  escitalopram (LEXAPRO) 10 MG tablet Take 10 mg by mouth daily.   Yes Historical Provider, MD  lisinopril-hydrochlorothiazide (PRINZIDE,ZESTORETIC) 20-12.5 MG tablet TAKE 1 TABLET BY MOUTH EVERY DAY 10/09/15  Yes Estill Dooms, NP  OVER THE COUNTER MEDICATION Over the counter heartburn medication takes as needed (pt unsure of name of med)   Yes Historical  Provider, MD  valACYclovir (VALTREX) 1000 MG tablet Take 1 tablet (1,000 mg total) by mouth daily. Patient taking differently: Take 1,000 mg by mouth as needed.  08/05/15  Yes Estill Dooms, NP    Allergies as of 06/07/2016 - Review Complete 06/07/2016  Allergen Reaction Noted  . Erythromycin Other (See Comments) 07/12/2012    Family History  Problem Relation Age of Onset  . Cancer Father     colon and lung  . COPD Father   . Colon cancer Father 85  . Atrial fibrillation Mother   . Diabetes Mother   . Stroke Sister   . Heart attack Brother   . Other Daughter     benign pituitary tumor    Social History   Social History  . Marital status: Married    Spouse name: N/A  . Number of children: N/A  . Years of education: N/A   Occupational History  . Not on file.   Social History Main Topics  . Smoking status: Never Smoker  . Smokeless tobacco: Never Used  . Alcohol use Yes     Comment: occ.  . Drug use: No  . Sexual activity: Not Currently    Birth control/ protection: Post-menopausal   Other Topics Concern  . Not on file   Social History Narrative  . No narrative on file    Review of Systems: See HPI, otherwise  negative ROS   Physical Exam: BP 109/71   Pulse 63   Temp 98.8 F (37.1 C) (Oral)   Resp 17   Ht 5\' 3"  (1.6 m)   Wt 149 lb (67.6 kg)   SpO2 100%   BMI 26.39 kg/m  General:   Alert,  pleasant and cooperative in NAD Head:  Normocephalic and atraumatic. Neck:  Supple; Lungs:  Clear throughout to auscultation.    Heart:  Regular rate and rhythm. Abdomen:  Soft, nontender and nondistended. Normal bowel sounds, without guarding, and without rebound.   Neurologic:  Alert and  oriented x4;  grossly normal neurologically.  Impression/Plan:     FAMILY Hx COLON CA-FATHER HAD COLON CA AGE < 60.  PLAN: 1. TCS TODAY. DISCUSSED PROCEDURE, BENEFITS, & RISKS: < 1% chance of medication reaction, bleeding, perforation, or rupture of spleen/liver.

## 2016-07-19 NOTE — Op Note (Signed)
[facility] Patient Name: Shannon Lindsey Procedure Date: 07/19/2016 8:15 AM MRN: [id number] Date of Birth: May 30, 1965 Attending MD: Barney Drain , MD CSN: [id number] Age: 51 Admit Type: Outpatient Procedure:                Colonoscopy WITH COLD SNARE POLYPECTOMY Indications:              High risk colon cancer surveillance: Personal                            history of colonic polyps(2007), Family history of                            colonic polyps in a first-degree relative(AGE 62:                            father) Providers:                Barney Drain, MD, Rosina Lowenstein, RN, Charlyne Petrin RN, RN Referring MD:             Newt Minion, MD Medicines:                Promethazine 25 mg IV, Meperidine 75 mg IV,                            Midazolam 4 mg IV Complications:            No immediate complications. Estimated Blood Loss:     Estimated blood loss was minimal. Procedure:                Pre-Anesthesia Assessment:                           - Prior to the procedure, a History and Physical                            was performed, and patient medications and                            allergies were reviewed. The patient's tolerance of                            previous anesthesia was also reviewed. The risks                            and benefits of the procedure and the sedation                            options and risks were discussed with the patient.                            All questions were answered, and informed consent  was obtained. Prior Anticoagulants: The patient has                            taken no previous anticoagulant or antiplatelet                            agents. ASA Grade Assessment: II - A patient with                            mild systemic disease. After reviewing the risks                            and benefits, the patient was deemed in                            satisfactory  condition to undergo the procedure.                            After obtaining informed consent, the colonoscope                            was passed under direct vision. Throughout the                            procedure, the patient's blood pressure, pulse, and                            oxygen saturations were monitored continuously. The                            EC-[id number] ([id number]) scope was introduced through                            the anus and advanced to the the cecum, identified                            by appendiceal orifice and ileocecal valve. The                            colonoscopy was performed without difficulty. The                            patient tolerated the procedure well with COLOWRAP.                            The quality of the bowel preparation was good. The                            ileocecal valve, appendiceal orifice, and rectum                            were photographed. Scope In: 8:56:20 AM Scope Out: 9:10:54 AM Scope Withdrawal Time: 0 hours 12 minutes 0 seconds  Total Procedure  Duration: 0 hours 14 minutes 34 seconds  Findings:      A 6 mm polyp was found in the splenic flexure. The polyp was sessile.       The polyp was removed with a cold snare. Resection and retrieval were       complete.      Multiple small and large-mouthed diverticula were found in the       recto-sigmoid colon and sigmoid colon.      Internal hemorrhoids were found during retroflexion. The hemorrhoids       were small. Impression:               - One 6 mm polyp at the splenic flexure, removed                            with a cold snare. Resected and retrieved.                           - Diverticulosis in the recto-sigmoid colon and in                            the sigmoid colon.                           - SMALL Internal hemorrhoids. Moderate Sedation:      Moderate (conscious) sedation was administered by the endoscopy nurse       and supervised by the  endoscopist. The following parameters were       monitored: oxygen saturation, heart rate, blood pressure, and response       to care. Total physician intraservice time was 28 minutes. Recommendation:           - Repeat colonoscopy in 5 years for surveillance.                           - High fiber diet.                           - Continue present medications.                           - Await pathology results.                           - Patient has a contact number available for                            emergencies. The signs and symptoms of potential                            delayed complications were discussed with the                            patient. Return to normal activities tomorrow.                            Written discharge instructions were provided to the  patient. Procedure Code(s):        --- Professional ---                           [phone], Colonoscopy, flexible; with removal of                            tumor(s), polyp(s), or other lesion(s) by snare                            technique                           99152, Moderate sedation services provided by the                            same physician or other qualified health care                            professional performing the diagnostic or                            therapeutic service that the sedation supports,                            requiring the presence of an independent trained                            observer to assist in the monitoring of the                            patient's level of consciousness and physiological                            status; initial 15 minutes of intraservice time,                            patient age 62 years or older                           [phone], Moderate sedation services; each additional                            15 minutes intraservice time Diagnosis Code(s):        --- Professional ---                            Z86.010, Personal history of colonic polyps                           D12.3, Benign neoplasm of transverse colon (hepatic                            flexure or splenic flexure)  K64.8, Other hemorrhoids                           Z83.71, Family history of colonic polyps                           K57.30, Diverticulosis of large intestine without                            perforation or abscess without bleeding CPT copyright 2016 [employer]. All rights reserved. The codes documented in this report are preliminary and upon coder review may  be revised to meet current compliance requirements. Barney Drain, MD Barney Drain, MD 07/19/2016 9:27:04 AM This report has been signed electronically. Number of Addenda: 0

## 2016-07-19 NOTE — Discharge Instructions (Signed)
You have small internal hemorrhoids. YOU HAVE diverticulosis IN YOUR LEFT COLON. You had TWO SMALL POLYPS REMOVED.    DRINK WATER TO KEEP YOUR URINE LIGHT YELLOW.  FOLLOW A HIGH FIBER DIET. AVOID ITEMS THAT CAUSE BLOATING. See info below.  USE PREPARATION H FOUR TIMES  A DAY IF NEEDED TO RELIEVE RECTAL PAIN/PRESSURE/BLEEDING.   YOUR BIOPSY RESULTS WILL BE AVAILABLE IN MY CHART  Apr 27 AND MY OFFICE WILL CONTACT YOU IN 10-14 DAYS WITH YOUR RESULTS.   Next colonoscopy in 5 years.  Colonoscopy Care After Read the instructions outlined below and refer to this sheet in the next week. These discharge instructions provide you with general information on caring for yourself after you leave the hospital. While your treatment has been planned according to the most current medical practices available, unavoidable complications occasionally occur. If you have any problems or questions after discharge, call DR. Carlyle Achenbach, [phone].  ACTIVITY  You may resume your regular activity, but move at a slower pace for the next 24 hours.   Take frequent rest periods for the next 24 hours.   Walking will help get rid of the air and reduce the bloated feeling in your belly (abdomen).   No driving for 24 hours (because of the medicine (anesthesia) used during the test).   You may shower.   Do not sign any important legal documents or operate any machinery for 24 hours (because of the anesthesia used during the test).    NUTRITION  Drink plenty of fluids.   You may resume your normal diet as instructed by your doctor.   Begin with a light meal and progress to your normal diet. Heavy or fried foods are harder to digest and may make you feel sick to your stomach (nauseated).   Avoid alcoholic beverages for 24 hours or as instructed.    MEDICATIONS  You may resume your normal medications.   WHAT YOU CAN EXPECT TODAY  Some feelings of bloating in the abdomen.   Passage of more gas than usual.     Spotting of blood in your stool or on the toilet paper  .  IF YOU HAD POLYPS REMOVED DURING THE COLONOSCOPY:  Eat a soft diet IF YOU HAVE NAUSEA, BLOATING, ABDOMINAL PAIN, OR VOMITING.    FINDING OUT THE RESULTS OF YOUR TEST Not all test results are available during your visit. DR. Oneida Alar WILL CALL YOU WITHIN 14 DAYS OF YOUR PROCEDUE WITH YOUR RESULTS. Do not assume everything is normal if you have not heard from DR. Clella Mckeel, CALL HER OFFICE AT [phone].  SEEK IMMEDIATE MEDICAL ATTENTION AND CALL THE OFFICE: [phone] IF:  You have more than a spotting of blood in your stool.   Your belly is swollen (abdominal distention).   You are nauseated or vomiting.   You have a temperature over 101F.   You have abdominal pain or discomfort that is severe or gets worse throughout the day.  High-Fiber Diet A high-fiber diet changes your normal diet to include more whole grains, legumes, fruits, and vegetables. Changes in the diet involve replacing refined carbohydrates with unrefined foods. The calorie level of the diet is essentially unchanged. The Dietary Reference Intake (recommended amount) for adult males is 38 grams per day. For adult females, it is 25 grams per day. Pregnant and lactating women should consume 28 grams of fiber per day. Fiber is the intact part of a plant that is not broken down during digestion. Functional fiber is fiber that has  been isolated from the plant to provide a beneficial effect in the body. PURPOSE  Increase stool bulk.   Ease and regulate bowel movements.   Lower cholesterol.  REDUCE RISK OF COLON CANCER  INDICATIONS THAT YOU NEED MORE FIBER  Constipation and hemorrhoids.   Uncomplicated diverticulosis (intestine condition) and irritable bowel syndrome.   Weight management.   As a protective measure against hardening of the arteries (atherosclerosis), diabetes, and cancer.   GUIDELINES FOR INCREASING FIBER IN THE DIET  Start adding  fiber to the diet slowly. A gradual increase of about 5 more grams (2 slices of whole-wheat bread, 2 servings of most fruits or vegetables, or 1 bowl of high-fiber cereal) per day is best. Too rapid an increase in fiber may result in constipation, flatulence, and bloating.   Drink enough water and fluids to keep your urine clear or pale yellow. Water, juice, or caffeine-free drinks are recommended. Not drinking enough fluid may cause constipation.   Eat a variety of high-fiber foods rather than one type of fiber.   Try to increase your intake of fiber through using high-fiber foods rather than fiber pills or supplements that contain small amounts of fiber.   The goal is to change the types of food eaten. Do not supplement your present diet with high-fiber foods, but replace foods in your present diet.   INCLUDE A VARIETY OF FIBER SOURCES  Replace refined and processed grains with whole grains, canned fruits with fresh fruits, and incorporate other fiber sources. White rice, white breads, and most bakery goods contain little or no fiber.   Brown whole-grain rice, buckwheat oats, and many fruits and vegetables are all good sources of fiber. These include: broccoli, Brussels sprouts, cabbage, cauliflower, beets, sweet potatoes, white potatoes (skin on), carrots, tomatoes, eggplant, squash, berries, fresh fruits, and dried fruits.   Cereals appear to be the richest source of fiber. Cereal fiber is found in whole grains and bran. Bran is the fiber-rich outer coat of cereal grain, which is largely removed in refining. In whole-grain cereals, the bran remains. In breakfast cereals, the largest amount of fiber is found in those with "bran" in their names. The fiber content is sometimes indicated on the label.   You may need to include additional fruits and vegetables each day.   In baking, for 1 cup white flour, you may use the following substitutions:   1 cup whole-wheat flour minus 2 tablespoons.    1/2 cup white flour plus 1/2 cup whole-wheat flour.   Polyps, Colon  A polyp is extra tissue that grows inside your body. Colon polyps grow in the large intestine. The large intestine, also called the colon, is part of your digestive system. It is a long, hollow tube at the end of your digestive tract where your body makes and stores stool. Most polyps are not dangerous. They are benign. This means they are not cancerous. But over time, some types of polyps can turn into cancer. Polyps that are smaller than a pea are usually not harmful. But larger polyps could someday become or may already be cancerous. To be safe, doctors remove all polyps and test them.   PREVENTION There is not one sure way to prevent polyps. You might be able to lower your risk of getting them if you:  Eat more fruits and vegetables and less fatty food.   Do not smoke.   Avoid alcohol.   Exercise every day.   Lose weight if you are overweight.  Eating more calcium and folate can also lower your risk of getting polyps. Some foods that are rich in calcium are milk, cheese, and broccoli. Some foods that are rich in folate are chickpeas, kidney beans, and spinach.    Diverticulosis Diverticulosis is a common condition that develops when small pouches (diverticula) form in the wall of the colon. The risk of diverticulosis increases with age. It happens more often in people who eat a low-fiber diet. Most individuals with diverticulosis have no symptoms. Those individuals with symptoms usually experience belly (abdominal) pain, constipation, or loose stools (diarrhea).  HOME CARE INSTRUCTIONS  Increase the amount of fiber in your diet as directed by your caregiver or dietician. This may reduce symptoms of diverticulosis.   Drink at least 6 to 8 glasses of water each day to prevent constipation.   Try not to strain when you have a bowel movement.   Avoiding nuts and seeds to prevent complications is NOT NECESSARY.      FOODS HAVING HIGH FIBER CONTENT INCLUDE:  Fruits. Apple, peach, pear, tangerine, raisins, prunes.   Vegetables. Brussels sprouts, asparagus, broccoli, cabbage, carrot, cauliflower, romaine lettuce, spinach, summer squash, tomato, winter squash, zucchini.   Starchy Vegetables. Baked beans, kidney beans, lima beans, split peas, lentils, potatoes (with skin).   Grains. Whole wheat bread, brown rice, bran flake cereal, plain oatmeal, white rice, shredded wheat, bran muffins.    SEEK IMMEDIATE MEDICAL CARE IF:  You develop increasing pain or severe bloating.   You have an oral temperature above 101F.   You develop vomiting or bowel movements that are bloody or black.   Hemorrhoids Hemorrhoids are dilated (enlarged) veins around the rectum. Sometimes clots will form in the veins. This makes them swollen and painful. These are called thrombosed hemorrhoids. Causes of hemorrhoids include:  Constipation.   Straining to have a bowel movement.   HEAVY LIFTING  HOME CARE INSTRUCTIONS  Eat a well balanced diet and drink 6 to 8 glasses of water every day to avoid constipation. You may also use a bulk laxative.   Avoid straining to have bowel movements.   Keep anal area dry and clean.   Do not use a donut shaped pillow or sit on the toilet for long periods. This increases blood pooling and pain.   Move your bowels when your body has the urge; this will require less straining and will decrease pain and pressure.

## 2016-07-23 ENCOUNTER — Encounter (HOSPITAL_COMMUNITY): Payer: Self-pay | Admitting: Gastroenterology

## 2016-08-05 ENCOUNTER — Telehealth: Payer: Self-pay | Admitting: Gastroenterology

## 2016-08-05 NOTE — Telephone Encounter (Signed)
Please call pt. She had a BENIGN polypoid lesion removed.    DRINK WATER TO KEEP YOUR URINE LIGHT YELLOW.  FOLLOW A HIGH FIBER DIET. AVOID ITEMS THAT CAUSE BLOATING.  USE PREPARATION H FOUR TIMES  A DAY IF NEEDED TO RELIEVE RECTAL PAIN/PRESSURE/BLEEDING.   Next colonoscopy in 5 years.

## 2016-08-05 NOTE — Telephone Encounter (Signed)
Called and informed pt.  

## 2016-08-06 NOTE — Telephone Encounter (Signed)
Reminder in epic °

## 2016-09-10 ENCOUNTER — Ambulatory Visit (INDEPENDENT_AMBULATORY_CARE_PROVIDER_SITE_OTHER): Payer: BLUE CROSS/BLUE SHIELD | Admitting: Family Medicine

## 2016-09-10 ENCOUNTER — Encounter: Payer: Self-pay | Admitting: Family Medicine

## 2016-09-10 VITALS — BP 104/60 | HR 67 | Temp 98.8°F | Ht 62.75 in | Wt 157.6 lb

## 2016-09-10 DIAGNOSIS — M5431 Sciatica, right side: Secondary | ICD-10-CM

## 2016-09-10 DIAGNOSIS — S39012A Strain of muscle, fascia and tendon of lower back, initial encounter: Secondary | ICD-10-CM | POA: Diagnosis not present

## 2016-09-10 MED ORDER — CYCLOBENZAPRINE HCL 10 MG PO TABS: 10.0000 mg | ORAL_TABLET | Freq: Three times a day (TID) | ORAL | 0 refills | Status: DC | PRN

## 2016-09-10 MED ORDER — PREDNISONE 10 MG (21) PO TBPK: ORAL_TABLET | ORAL | 0 refills | Status: DC

## 2016-09-10 NOTE — Progress Notes (Signed)
Chief Complaint  Patient presents with  . Back Pain    lower x 1 wk    HPI   She reports that she was mowing and a branch caughter her right arm and twist her back was uncomfortable but not severely injuried She states that the initial pain was one week ago and was resolving until she fell over the weekend. 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 Abnormal Pap smear   . Burning with urination 09/29/2015  . Cancer (HCC)    squamous cell chest  . Carpal tunnel syndrome, bilateral 09/29/2015  . H/O hiatal hernia   . HSV-1 (herpes simplex virus 1) infection   . HSV-2 (herpes simplex virus 2) infection   . Hyperlipidemia   . Hypertension   . Vaginal dryness 07/17/2014  . Vaginal Pap smear, abnormal   . Vitamin D deficiency 09/09/2015  . Warts, genital     Current Outpatient Prescriptions  Medication Sig Dispense Refill  . Cholecalciferol 5000 units capsule Take 1 capsule (5,000 Units total) by mouth daily.    Marland Kitchen escitalopram (LEXAPRO) 10 MG tablet Take 10 mg by mouth daily.    Marland Kitchen ibuprofen (ADVIL,MOTRIN) 200 MG tablet Take 200 mg by mouth every 6 (six) hours as needed.    Marland Kitchen lisinopril-hydrochlorothiazide (PRINZIDE,ZESTORETIC) 20-12.5 MG tablet TAKE 1 TABLET BY MOUTH EVERY DAY 90 tablet 3  . valACYclovir (VALTREX) 1000 MG tablet Take 1 tablet (1,000 mg total) by mouth daily. (Patient taking differently: Take 1,000 mg by mouth as needed. ) 30 tablet prn  . cyclobenzaprine (FLEXERIL) 10 MG tablet Take 1 tablet (10 mg  total) by mouth 3 (three) times daily as needed for muscle spasms. 30 tablet 0  . OVER THE COUNTER MEDICATION Over the counter heartburn medication takes as needed (pt unsure of name of med)    . predniSONE (STERAPRED UNI-PAK 21 TAB) 10 MG (21) TBPK tablet Take 6 tabs on day 1, 5 tabs on day 2, 4 tabs on day 3, 3 tabs on day 4, 2 tabs on day 5, 1 tab on day 6 21 tablet 0   No current facility-administered medications for this visit.     Allergies:  Allergies  Allergen Reactions  . Erythromycin Other (See Comments)    Gets a UTI every time.  Nada Libman [Promethazine Hcl]     25 m iv made her feel jittery and nauseated. TOLERATED 6.25 MG IV.    Past Surgical History:  Procedure Laterality Date  . CESAREAN SECTION    . COLONOSCOPY N/A 07/19/2016   Procedure: COLONOSCOPY;  Surgeon: Danie Binder, MD;  Location: [facility];  Service: Endoscopy;  Laterality: N/A;  10:30 - moved to 4/23 @ 8:30, per [place]  . COLONOSCOPY WITH ESOPHAGOGASTRODUODENOSCOPY (EGD)  2011   Dr. Oneida Alar: small hh, gastritis bx negative for h.pylori, diverticulosis of colon, two hyperplastic colon polyps removed. TCS in 5 years due to father having colon cancer at age  52  . COLPOSCOPY W/ BIOPSY / CURETTAGE    . EXPLORATORY LAPAROTOMY    . MELANOMA EXCISION Right 12/25/2012   Procedure: wide EXCISION squamous cell carcinoma right chest ;  Surgeon: Zenovia Jarred, MD;  Location: [facility];  Service: General;  Laterality: Right;  . MOLE REMOVAL     has had 30 + removed not cancer  . TONSILLECTOMY    . TUBAL LIGATION      Social History   Social History  . Marital status: Legally Separated    Spouse name: N/A  . Number of children: N/A  . Years of education: N/A   Social History Main Topics  . Smoking status: Never Smoker  . Smokeless tobacco: Never Used  . Alcohol use Yes     Comment: occ.  . Drug use: No  . Sexual activity: Not Currently    Birth control/ protection: Post-menopausal   Other Topics  Concern  . None   Social History Narrative  . None    ROS  Objective: Vitals:   09/10/16 1114  BP: 104/60  Pulse: 67  Temp: 98.8 F (37.1 C)  TempSrc: Oral  SpO2: 99%  Weight: 157 lb 9.6 oz (71.5 kg)  Height: 5' 2.75" (1.594 m)    Physical Exam  Constitutional: She is oriented to person, place, and time. She appears well-developed and well-nourished.  HENT:  Head: Normocephalic and atraumatic.  Eyes: Conjunctivae and EOM are normal.  Cardiovascular: Normal rate, regular rhythm and normal heart sounds.   Pulmonary/Chest: Effort normal and breath sounds normal. No respiratory distress. She has no wheezes.  Musculoskeletal:       Lumbar back: She exhibits tenderness and spasm. She exhibits no swelling, no edema and no deformity.       Back:  Neurological: She is alert and oriented to person, place, and time.  Psychiatric: She has a normal mood and affect. Her behavior is normal. Judgment and thought content normal.      Assessment and Plan Shannon Lindsey was seen today for back pain.  Diagnoses and all orders for this visit:  Strain of lumbar region, initial encounter Sciatic pain, right  Advised pt to take prednisone and flexeril Gave precautions Discussed prevention of reinjury in the future -     predniSONE (STERAPRED UNI-PAK 21 TAB) 10 MG (21) TBPK tablet; Take 6 tabs on day 1, 5 tabs on day 2, 4 tabs on day 3, 3 tabs on day 4, 2 tabs on day 5, 1 tab on day 6 -     cyclobenzaprine (FLEXERIL) 10 MG tablet; Take 1 tablet (10 mg total) by mouth 3 (three) times daily as needed for muscle spasms.     [place]

## 2016-09-10 NOTE — Patient Instructions (Addendum)
IF you received an x-ray today, you will receive an invoice from [facility]. Please contact [facility] at [phone] with questions or concerns regarding your invoice.   IF you received labwork today, you will receive an invoice from [place]. Please contact [facility] at [phone] with questions or concerns regarding your invoice.   Our billing staff will not be able to assist you with questions regarding bills from these companies.  You will be contacted with the lab results as soon as they are available. The fastest way to get your results is to activate your My Chart account. Instructions are located on the last page of this paperwork. If you have not heard from [place] regarding the results in 2 weeks, please contact this office.     Low Back Strain A strain is a stretch or tear in a muscle or the strong cords of tissue that attach muscle to bone (tendons). Strains of the lower back (lumbar spine) are a common cause of low back pain. A strain occurs when muscles or tendons are torn or are stretched beyond their limits. The muscles may become inflamed, resulting in pain and sudden muscle tightening (spasms). A strain can happen suddenly due to an injury (trauma), or it can develop gradually due to overuse. There are three types of strains:  Grade 1 is a mild strain involving a minor tear of the muscle fibers or tendons. This may cause some pain but no loss of muscle strength.  Grade 2 is a moderate strain involving a partial tear of the muscle fibers or tendons. This causes more severe pain and some loss of muscle strength.  Grade 3 is a severe strain involving a complete tear of the muscle or tendon. This causes severe pain and complete or nearly complete loss of muscle strength.  What are the causes? This condition may be caused by:  Trauma, such as a fall or a hit to the body.  Twisting or overstretching the back. This may result from doing activities that  require a lot of energy, such as lifting heavy objects.  What increases the risk? The following factors may increase your risk of getting this condition:  Playing contact sports.  Participating in sports or activities that put excessive stress on the back and require a lot of bending and twisting, including: ? Lifting weights or heavy objects. ? Gymnastics. ? Soccer. ? Figure skating. ? Snowboarding.  Being overweight or obese.  Having poor strength and flexibility.  What are the signs or symptoms? Symptoms of this condition may include:  Sharp or dull pain in the lower back that does not go away. Pain may extend to the buttocks.  Stiffness.  Limited range of motion.  Inability to stand up straight due to stiffness or pain.  Muscle spasms.  How is this diagnosed? This condition may be diagnosed based on:  Your symptoms.  Your medical history.  A physical exam. ? Your health care provider may push on certain areas of your back to determine the source of your pain. ? You may be asked to bend forward, backward, and side to side to assess the severity of your pain and your range of motion.  Imaging tests, such as: ? X-rays. ? MRI.  How is this treated? Treatment for this condition may include:  Applying heat and cold to the affected area.  Medicines to help relieve pain and to relax your muscles (muscle relaxants).  NSAIDs to help reduce swelling and discomfort.  Physical therapy.  When your symptoms improve, it is important to gradually return to your normal routine as soon as possible to reduce pain, avoid stiffness, and avoid loss of muscle strength. Generally, symptoms should improve within 6 weeks of treatment. However, recovery time varies. Follow these instructions at home: Managing pain, stiffness, and swelling  If directed, apply ice to the injured area during the first 24 hours after your injury. ? Put ice in a plastic bag. ? Place a towel between  your skin and the bag. ? Leave the ice on for 20 minutes, 2-3 times a day.  If directed, apply heat to the affected area as often as told by your health care provider. Use the heat source that your health care provider recommends, such as a moist heat pack or a heating pad. ? Place a towel between your skin and the heat source. ? Leave the heat on for 20-30 minutes. ? Remove the heat if your skin turns bright red. This is especially important if you are unable to feel pain, heat, or cold. You may have a greater risk of getting burned. Activity  Rest and return to your normal activities as told by your health care provider. Ask your health care provider what activities are safe for you.  Avoid activities that take a lot of effort (are strenuous) for as long as told by your health care provider.  Do exercises as told by your health care provider. General instructions   Take over-the-counter and prescription medicines only as told by your health care provider.  If you have questions or concerns about safety while taking pain medicine, talk with your health care provider.  Do not drive or operate heavy machinery until you know how your pain medicine affects you.  Do not use any tobacco products, such as cigarettes, chewing tobacco, and e-cigarettes. Tobacco can delay bone healing. If you need help quitting, ask your health care provider.  Keep all follow-up visits as told by your health care provider. This is important. How is this prevented?  Warm up and stretch before being active.  Cool down and stretch after being active.  Give your body time to rest between periods of activity.  Avoid: ? Being physically inactive for long periods at a time. ? Exercising or playing sports when you are tired or in pain.  Use correct form when playing sports and lifting heavy objects.  Use good posture when sitting and standing.  Maintain a healthy weight.  Sleep on a mattress with medium  firmness to support your back.  Make sure to use equipment that fits you, including shoes that fit well.  Be safe and responsible while being active to avoid falls.  Do at least 150 minutes of moderate-intensity exercise each week, such as brisk walking or water aerobics. Try a form of exercise that takes stress off your back, such as swimming or stationary cycling.  Maintain physical fitness, including: ? Strength. ? Flexibility. ? Cardiovascular fitness. ? Endurance. Contact a health care provider if:  Your back pain does not improve after 6 weeks of treatment.  Your symptoms get worse. Get help right away if:  Your back pain is severe.  You are unable to stand or walk.  You develop pain in your legs.  You develop weakness in your buttocks or legs.  You have difficulty controlling when you urinate or when you have a bowel movement. This information is not intended to replace advice given to you by your health  care provider. Make sure you discuss any questions you have with your health care provider. Document Released: 03/15/2005 Document Revised: 11/20/2015 Document Reviewed: 12/25/2014 Elsevier Interactive Patient Education  2017 [employer].

## 2016-10-21 ENCOUNTER — Other Ambulatory Visit: Payer: Self-pay | Admitting: Adult Health

## 2016-10-28 ENCOUNTER — Telehealth: Payer: Self-pay | Admitting: Gastroenterology

## 2016-10-28 NOTE — Telephone Encounter (Signed)
Pt had colonoscopy by [facility] on 07/19/2016 and has questions about how it was billed/coded. She said it was coded as a surgery and not routine. She is anxious to have it corrected (if needed) so it doesn't go against her credit. I told her that I don't have access to the billing records because we do not do the billing here at the office. She said that she needed to speak with some ASAP today. I told her that I would let my office manager be aware of her concerns and questions. Please call [phone]

## 2016-10-28 NOTE — Telephone Encounter (Signed)
I spoke with the patient and made her aware of the codes we used for her procedure.  She stated she was going to speak with her insurance company and get back to me.

## 2016-10-29 NOTE — Telephone Encounter (Signed)
I spoke with the patient and made her aware that we are sending her claim back through coding review and I will reach out to her once I receive a report from our coding department.

## 2016-11-02 NOTE — Telephone Encounter (Signed)
Dawn with [facility] will call the patient with an update

## 2016-12-20 ENCOUNTER — Encounter (INDEPENDENT_AMBULATORY_CARE_PROVIDER_SITE_OTHER): Payer: Self-pay

## 2016-12-20 ENCOUNTER — Ambulatory Visit (INDEPENDENT_AMBULATORY_CARE_PROVIDER_SITE_OTHER): Payer: BLUE CROSS/BLUE SHIELD | Admitting: Adult Health

## 2016-12-20 ENCOUNTER — Encounter: Payer: Self-pay | Admitting: Adult Health

## 2016-12-20 VITALS — BP 122/78 | HR 75 | Ht 63.0 in | Wt 166.0 lb

## 2016-12-20 DIAGNOSIS — E559 Vitamin D deficiency, unspecified: Secondary | ICD-10-CM | POA: Diagnosis not present

## 2016-12-20 DIAGNOSIS — I10 Essential (primary) hypertension: Secondary | ICD-10-CM

## 2016-12-20 DIAGNOSIS — Z01419 Encounter for gynecological examination (general) (routine) without abnormal findings: Secondary | ICD-10-CM | POA: Diagnosis not present

## 2016-12-20 DIAGNOSIS — Z13220 Encounter for screening for lipoid disorders: Secondary | ICD-10-CM | POA: Diagnosis not present

## 2016-12-20 DIAGNOSIS — Z01411 Encounter for gynecological examination (general) (routine) with abnormal findings: Secondary | ICD-10-CM

## 2016-12-20 DIAGNOSIS — Z1212 Encounter for screening for malignant neoplasm of rectum: Secondary | ICD-10-CM

## 2016-12-20 DIAGNOSIS — Z1211 Encounter for screening for malignant neoplasm of colon: Secondary | ICD-10-CM | POA: Diagnosis not present

## 2016-12-20 DIAGNOSIS — N9089 Other specified noninflammatory disorders of vulva and perineum: Secondary | ICD-10-CM

## 2016-12-20 LAB — HEMOCCULT GUIAC POC 1CARD (OFFICE): Fecal Occult Blood, POC: NEGATIVE

## 2016-12-20 MED ORDER — LOSARTAN POTASSIUM-HCTZ 50-12.5 MG PO TABS: 1.0000 | ORAL_TABLET | Freq: Every day | ORAL | 12 refills | Status: DC

## 2016-12-20 MED ORDER — ESCITALOPRAM OXALATE 10 MG PO TABS: 10.0000 mg | ORAL_TABLET | Freq: Every day | ORAL | 12 refills | Status: DC

## 2016-12-20 NOTE — Progress Notes (Signed)
Patient ID: Shannon Lindsey, female   DOB: 29-Oct-1965, 51 y.o.   MRN: [id number] History of Present Illness:  Shannon Lindsey is a 51 year old white female, divorced in for well woman gyn exam, pap was normal with negative HPV 09/29/15.  PCP is Dr Karie Kirks.   Current Medications, Allergies, Past Medical History, Past Surgical History, Family History and Social History were reviewed in [employer] record.     Review of Systems: Patient denies any headaches, hearing loss, fatigue, blurred vision, shortness of breath, chest pain, abdominal pain, problems with bowel movements, urination, or intercourse(not having sex). No joint pain or mood swings. +cough ? Related to lisinopril    Physical Exam:BP 122/78 (BP Location: Left Arm, Patient Position: Sitting, Cuff Size: Normal)   Pulse 75   Ht 5\' 3"  (1.6 m)   Wt 166 lb (75.3 kg)   BMI 29.41 kg/m  General:  Well developed, well nourished, no acute distress Skin:  Warm and dry Neck:  Midline trachea, normal thyroid, good ROM, no lymphadenopathy Lungs; Clear to auscultation bilaterally Breast:  No dominant palpable mass, retraction, or nipple discharge Cardiovascular: Regular rate and rhythm Abdomen:  Soft, non tender, no hepatosplenomegaly Pelvic:  External genitalia is normal in appearance, has 5 mm skin/wart top left vulva.  The vagina is normal in appearance. Urethra has no lesions or masses. The cervix is smooth.  Uterus is felt to be normal size, shape, and contour.  No adnexal masses or tenderness noted.Bladder is non tender, no masses felt. Rectal: Good sphincter tone, no polyps, or hemorrhoids felt.  Hemoccult negative. Extremities/musculoskeletal:  No swelling or varicosities noted, no clubbing or cyanosis Psych:  No mood changes, alert and cooperative,seems happy PHQ 2 score 0. Will stop lisinopril and rx hyzaar  Impression: 1. Encounter for well woman exam with routine gynecological exam   2. Essential hypertension   3.  Screening for colorectal cancer   4. Vitamin D deficiency   5. Screening cholesterol level   6. Skin tag of vulva       Plan: Meds ordered this encounter  Medications  . losartan-hydrochlorothiazide (HYZAAR) 50-12.5 MG tablet    Sig: Take 1 tablet by mouth daily.    Dispense:  30 tablet    Refill:  12    Order Specific Question:   Supervising Provider    Answer:   Elonda Husky, LUTHER H [[id number]]  . escitalopram (LEXAPRO) 10 MG tablet    Sig: Take 1 tablet (10 mg total) by mouth daily.    Dispense:  30 tablet    Refill:  12    Order Specific Question:   Supervising Provider    Answer:   Florian Buff [[id number]]  Return in 2 weeks for skin tag removal with Dr Glo Herring  Check CBC,CMP,TSH and lipids,Vitamin D  Physical in 1 year Pap in 2020 Mammogram yearly Colonoscopy per GI

## 2016-12-20 NOTE — Patient Instructions (Signed)
Physical in 1 year Pap in 2020 Mammogram yearly Colonoscopy per GI

## 2016-12-21 ENCOUNTER — Telehealth: Payer: Self-pay | Admitting: Adult Health

## 2016-12-21 LAB — LIPID PANEL
CHOLESTEROL TOTAL: 237 mg/dL — AB (ref 100–199)
Chol/HDL Ratio: 4 ratio (ref 0.0–4.4)
HDL: 60 mg/dL (ref >39)
LDL CALC: 164 mg/dL — AB (ref 0–99)
TRIGLYCERIDES: 64 mg/dL (ref 0–149)
VLDL CHOLESTEROL CAL: 13 mg/dL (ref 5–40)

## 2016-12-21 LAB — TSH: TSH: 1.2 u[IU]/mL (ref 0.450–4.500)

## 2016-12-21 LAB — COMPREHENSIVE METABOLIC PANEL
ALT: 38 IU/L — AB (ref 0–32)
AST: 22 IU/L (ref 0–40)
Albumin/Globulin Ratio: 1.7 (ref 1.2–2.2)
Albumin: 4.5 g/dL (ref 3.5–5.5)
Alkaline Phosphatase: 80 IU/L (ref 39–117)
BUN/Creatinine Ratio: 21 (ref 9–23)
BUN: 20 mg/dL (ref 6–24)
Bilirubin Total: 0.3 mg/dL (ref 0.0–1.2)
CO2: 25 mmol/L (ref 20–29)
CREATININE: 0.94 mg/dL (ref 0.57–1.00)
Calcium: 9.7 mg/dL (ref 8.7–10.2)
Chloride: 100 mmol/L (ref 96–106)
GFR calc Af Amer: 80 mL/min/{1.73_m2} (ref >59)
GFR calc non Af Amer: 69 mL/min/{1.73_m2} (ref >59)
GLOBULIN, TOTAL: 2.6 g/dL (ref 1.5–4.5)
Glucose: 108 mg/dL — ABNORMAL HIGH (ref 65–99)
Potassium: 4.3 mmol/L (ref 3.5–5.2)
Sodium: 140 mmol/L (ref 134–144)
TOTAL PROTEIN: 7.1 g/dL (ref 6.0–8.5)

## 2016-12-21 LAB — CBC
Hematocrit: 42 % (ref 34.0–46.6)
Hemoglobin: 13.9 g/dL (ref 11.1–15.9)
MCH: 28.4 pg (ref 26.6–33.0)
MCHC: 33.1 g/dL (ref 31.5–35.7)
MCV: 86 fL (ref 79–97)
PLATELETS: 310 10*3/uL (ref 150–379)
RBC: 4.9 x10E6/uL (ref 3.77–5.28)
RDW: 14 % (ref 12.3–15.4)
WBC: 5.5 10*3/uL (ref 3.4–10.8)

## 2016-12-21 LAB — VITAMIN D 25 HYDROXY (VIT D DEFICIENCY, FRACTURES): Vit D, 25-Hydroxy: 54.6 ng/mL (ref 30.0–100.0)

## 2016-12-21 NOTE — Telephone Encounter (Signed)
Left message about labs, decrease any alcohol and tylenol.

## 2017-01-03 ENCOUNTER — Ambulatory Visit: Payer: BLUE CROSS/BLUE SHIELD | Admitting: Obstetrics and Gynecology

## 2017-01-05 ENCOUNTER — Telehealth: Payer: Self-pay | Admitting: Adult Health

## 2017-01-05 NOTE — Telephone Encounter (Signed)
Still Has cough, will try zyrtec D

## 2017-02-01 DIAGNOSIS — Z08 Encounter for follow-up examination after completed treatment for malignant neoplasm: Secondary | ICD-10-CM | POA: Diagnosis not present

## 2017-02-01 DIAGNOSIS — Z1283 Encounter for screening for malignant neoplasm of skin: Secondary | ICD-10-CM | POA: Diagnosis not present

## 2017-02-01 DIAGNOSIS — Z85828 Personal history of other malignant neoplasm of skin: Secondary | ICD-10-CM | POA: Diagnosis not present

## 2017-02-01 DIAGNOSIS — L570 Actinic keratosis: Secondary | ICD-10-CM | POA: Diagnosis not present

## 2017-02-01 DIAGNOSIS — X32XXXD Exposure to sunlight, subsequent encounter: Secondary | ICD-10-CM | POA: Diagnosis not present

## 2017-04-04 ENCOUNTER — Other Ambulatory Visit: Payer: Self-pay | Admitting: Adult Health

## 2017-04-04 MED ORDER — ESCITALOPRAM OXALATE 10 MG PO TABS: 10.0000 mg | ORAL_TABLET | Freq: Every day | ORAL | 4 refills | Status: DC

## 2017-04-04 NOTE — Progress Notes (Signed)
Refilled lexapro for 90 day supply

## 2017-04-06 ENCOUNTER — Ambulatory Visit (HOSPITAL_COMMUNITY)
Admission: EM | Admit: 2017-04-06 | Discharge: 2017-04-06 | Disposition: A | Discharge status: Home / Self Care | Payer: BLUE CROSS/BLUE SHIELD | Attending: Family Medicine | Admitting: Family Medicine

## 2017-04-06 ENCOUNTER — Other Ambulatory Visit: Payer: Self-pay

## 2017-04-06 ENCOUNTER — Encounter (HOSPITAL_COMMUNITY): Payer: Self-pay | Admitting: Emergency Medicine

## 2017-04-06 DIAGNOSIS — R238 Other skin changes: Secondary | ICD-10-CM | POA: Diagnosis not present

## 2017-04-06 NOTE — ED Triage Notes (Signed)
Pt states "I have a place under my arm that's sore, ive noticed it for two days". Pt c/o sore spot under her L arm pit which makes her arm feel tingly every so often.

## 2017-04-12 ENCOUNTER — Other Ambulatory Visit: Payer: Self-pay | Admitting: Obstetrics and Gynecology

## 2017-04-12 DIAGNOSIS — Z1231 Encounter for screening mammogram for malignant neoplasm of breast: Secondary | ICD-10-CM

## 2017-04-12 NOTE — ED Provider Notes (Signed)
[facility]   [id number] 04/06/17 Arrival Time: 1601  ASSESSMENT & PLAN:  1. Skin irritation    This seems very superficial. Reassured that I do not think this is anything to be concerned over. Close observation. Will f/u with PCP if worsening.  Reviewed expectations re: course of current medical issues. Questions answered. Outlined signs and symptoms indicating need for more acute intervention. Patient verbalized understanding. After Visit Summary given.   SUBJECTIVE:  Shannon Lindsey is a 52 y.o. female who presents with complaint of "a little place under my arm that's kind of sore." Noticed 2 days ago. No change. No injury to area. In L axilla. Shaves here regularly. No masses reported. Afebrile. No specific aggravating or alleviating factors reported. No h/o similar.   ROS: As per HPI.  OBJECTIVE: Vitals:   04/06/17 1927  BP: 133/73  Pulse: 71  Resp: 18  Temp: 97.7 F (36.5 C)  SpO2: 100%    General appearance: alert; no distress Lungs: clear to auscultation bilaterally Heart: regular rate and rhythm Extremities: no edema Skin: warm and dry; L axilla with very superficial fibrous-feeling area approx 1 cm or less in size with thin rope-like feel; no tenderness; no skin changes Psychological: alert and cooperative; normal mood and affect  Allergies  Allergen Reactions  . Erythromycin Other (See Comments)    Gets a UTI every time.  . Lisinopril Other (See Comments)    cough  . Phenergan [Promethazine Hcl]     25 m iv made her feel jittery and nauseated. TOLERATED 6.25 MG IV.    Past Medical History:  Diagnosis Date  . Abnormal Pap smear   . Burning with urination 09/29/2015  . Cancer (HCC)    squamous cell chest  . Carpal tunnel syndrome, bilateral 09/29/2015  . Diverticulitis   . H/O hiatal hernia   . HSV-1 (herpes simplex virus 1) infection   . HSV-2 (herpes simplex virus 2) infection   . Hyperlipidemia   . Hypertension   . Vaginal dryness  07/17/2014  . Vaginal Pap smear, abnormal   . Vitamin D deficiency 09/09/2015  . Warts, genital    Social History   Socioeconomic History  . Marital status: Legally Separated    Spouse name: Not on file  . Number of children: Not on file  . Years of education: Not on file  . Highest education level: Not on file  Social Needs  . Financial resource strain: Not on file  . Food insecurity - worry: Not on file  . Food insecurity - inability: Not on file  . Transportation needs - medical: Not on file  . Transportation needs - non-medical: Not on file  Occupational History  . Not on file  Tobacco Use  . Smoking status: Never Smoker  . Smokeless tobacco: Never Used  Substance and Sexual Activity  . Alcohol use: Yes    Comment: occ.  . Drug use: No  . Sexual activity: Not Currently    Birth control/protection: Post-menopausal, Surgical    Comment: tubal  Other Topics Concern  . Not on file  Social History Narrative  . Not on file   Family History  Problem Relation Age of Onset  . Cancer Father        colon and lung  . COPD Father   . Colon cancer Father 64  . Atrial fibrillation Mother   . Diabetes Mother   . Hyperlipidemia Mother   . Hypertension Mother   . Stroke Sister   .  Heart attack Brother   . Other Daughter        benign pituitary tumor   Past Surgical History:  Procedure Laterality Date  . CESAREAN SECTION    . COLONOSCOPY N/A 07/19/2016   Procedure: COLONOSCOPY;  Surgeon: Danie Binder, MD;  Location: [facility];  Service: Endoscopy;  Laterality: N/A;  10:30 - moved to 4/23 @ 8:30, per [place]  . COLONOSCOPY WITH ESOPHAGOGASTRODUODENOSCOPY (EGD)  2011   Dr. Oneida Alar: small hh, gastritis bx negative for h.pylori, diverticulosis of colon, two hyperplastic colon polyps removed. TCS in 5 years due to father having colon cancer at age 82  . COLPOSCOPY W/ BIOPSY / CURETTAGE    . EXPLORATORY LAPAROTOMY    . MELANOMA EXCISION Right 12/25/2012   Procedure: wide  EXCISION squamous cell carcinoma right chest ;  Surgeon: Zenovia Jarred, MD;  Location: [facility];  Service: General;  Laterality: Right;  . MOLE REMOVAL     has had 30 + removed not cancer  . TONSILLECTOMY    . TUBAL LIGATION       Vanessa Kick, MD 04/12/17 0930

## 2017-04-18 ENCOUNTER — Ambulatory Visit (HOSPITAL_COMMUNITY)
Admission: RE | Admit: 2017-04-18 | Discharge: 2017-04-18 | Disposition: A | Discharge status: Home / Self Care | Payer: BLUE CROSS/BLUE SHIELD | Source: Ambulatory Visit | Attending: Obstetrics and Gynecology | Admitting: Obstetrics and Gynecology

## 2017-04-18 DIAGNOSIS — Z1231 Encounter for screening mammogram for malignant neoplasm of breast: Secondary | ICD-10-CM

## 2017-05-09 ENCOUNTER — Encounter: Payer: Self-pay | Admitting: Obstetrics and Gynecology

## 2017-05-09 ENCOUNTER — Ambulatory Visit: Payer: BLUE CROSS/BLUE SHIELD | Admitting: Obstetrics and Gynecology

## 2017-05-09 ENCOUNTER — Other Ambulatory Visit: Payer: Self-pay

## 2017-05-09 VITALS — BP 122/78 | HR 70 | Ht 63.0 in | Wt 169.0 lb

## 2017-05-09 DIAGNOSIS — N9089 Other specified noninflammatory disorders of vulva and perineum: Secondary | ICD-10-CM | POA: Diagnosis not present

## 2017-05-09 MED ORDER — HYDROCODONE-ACETAMINOPHEN 5-325 MG PO TABS: 1.0000 | ORAL_TABLET | Freq: Four times a day (QID) | ORAL | 0 refills | Status: DC | PRN

## 2017-05-09 NOTE — Progress Notes (Signed)
Patient ID: Shannon Lindsey, female   DOB: [birth year], 52 y.o.   MRN: [id number]  SKIN TAG REMOVAL PROCEDURE NOTE The patient's identification was confirmed and consent was obtained. This procedure was performed by Jonnie Kind at 9:49 AM Site & Appearance: Left mons pubis, to left of clitoris 1cm. Sterile procedures observedyes Anesthetic used: 1% Lidocaine  AgNO3 applied post excision Skin tag excised under local anesthesia, site covered with dry dressing.  Patient tolerated procedure well without complications. Minimal blood loss. Instructions for care discussed verbally. additional written instructions for homecare and f/u.    By signing my name below, I, Margit Banda, attest that this documentation has been prepared under the direction and in the presence of Jonnie Kind, MD. Electronically Signed: Margit Banda, Medical Scribe. 05/09/17. 9:49 AM.  I personally performed the services described in this documentation, which was SCRIBED in my presence. The recorded information has been reviewed and considered accurate. It has been edited as necessary during review. Jonnie Kind, MD

## 2017-07-05 ENCOUNTER — Other Ambulatory Visit: Payer: Self-pay | Admitting: Adult Health

## 2017-08-22 DIAGNOSIS — E6609 Other obesity due to excess calories: Secondary | ICD-10-CM | POA: Diagnosis not present

## 2017-08-22 DIAGNOSIS — N951 Menopausal and female climacteric states: Secondary | ICD-10-CM | POA: Diagnosis not present

## 2017-08-22 DIAGNOSIS — R6882 Decreased libido: Secondary | ICD-10-CM | POA: Diagnosis not present

## 2017-08-22 DIAGNOSIS — F4321 Adjustment disorder with depressed mood: Secondary | ICD-10-CM | POA: Diagnosis not present

## 2017-10-31 DIAGNOSIS — L570 Actinic keratosis: Secondary | ICD-10-CM | POA: Diagnosis not present

## 2017-10-31 DIAGNOSIS — X32XXXD Exposure to sunlight, subsequent encounter: Secondary | ICD-10-CM | POA: Diagnosis not present

## 2017-10-31 DIAGNOSIS — C44729 Squamous cell carcinoma of skin of left lower limb, including hip: Secondary | ICD-10-CM | POA: Diagnosis not present

## 2018-01-12 ENCOUNTER — Other Ambulatory Visit: Payer: Self-pay | Admitting: Adult Health

## 2018-01-25 ENCOUNTER — Other Ambulatory Visit: Payer: Self-pay | Admitting: Adult Health

## 2018-01-25 MED ORDER — VALACYCLOVIR HCL 1 G PO TABS: 1000.0000 mg | ORAL_TABLET | Freq: Every day | ORAL | 99 refills | Status: DC

## 2018-01-25 NOTE — Progress Notes (Signed)
Refill valtrex 

## 2018-02-20 ENCOUNTER — Telehealth: Payer: Self-pay | Admitting: Gastroenterology

## 2018-02-20 NOTE — Telephone Encounter (Signed)
We haven't seen her since March 2018. She needs an appt for further evaluation.

## 2018-02-20 NOTE — Telephone Encounter (Signed)
Pt is scheduled to see Neil Crouch, PA on Wed 02/22/2018 at 10:30 Am.

## 2018-02-20 NOTE — Telephone Encounter (Signed)
Pt said [facility] did a colonoscopy on her and she has questions for the nurse. I told her the nurse was on another call and I could transfer her to VM. [phone]

## 2018-02-20 NOTE — Telephone Encounter (Signed)
I spoke to pt and she has been having LUQ pain intermittent for the last several months. It usually occurs daily for awhile. Said she eats a lot of spicy foods.  She has regular BM's daily, sometimes twice a day. But sometimes the stool is hard. She has hx of diverticulosis and diverticulitis.  She is aware that Dr. Oneida Alar is off this week and I will forward to Roseanne Kaufman, NP to advise!

## 2018-02-22 ENCOUNTER — Encounter: Payer: Self-pay | Admitting: Gastroenterology

## 2018-02-22 ENCOUNTER — Telehealth: Payer: Self-pay | Admitting: *Deleted

## 2018-02-22 ENCOUNTER — Ambulatory Visit: Payer: BLUE CROSS/BLUE SHIELD | Admitting: Gastroenterology

## 2018-02-22 VITALS — BP 138/67 | HR 79 | Temp 97.8°F | Ht 63.0 in | Wt 169.6 lb

## 2018-02-22 DIAGNOSIS — R140 Abdominal distension (gaseous): Secondary | ICD-10-CM | POA: Insufficient documentation

## 2018-02-22 DIAGNOSIS — R1032 Left lower quadrant pain: Secondary | ICD-10-CM | POA: Diagnosis not present

## 2018-02-22 DIAGNOSIS — R109 Unspecified abdominal pain: Secondary | ICD-10-CM | POA: Diagnosis not present

## 2018-02-22 MED ORDER — DICYCLOMINE HCL 10 MG PO CAPS: ORAL_CAPSULE | ORAL | 0 refills | Status: DC

## 2018-02-22 NOTE — Progress Notes (Signed)
Primary Care Physician: Lemmie Evens, MD  Primary Gastroenterologist:  Barney Drain, MD   Chief Complaint  Patient presents with  . Abdominal Pain    LUQ-LLQ. comes/goes    HPI: Shannon Lindsey is a 52 y.o. female here for semi-urgent office visit. Called two days ago with complaint of left sided abdominal pain.  Patient was hospitalized in November 3664 with uncomplicated acute diverticulitis.  She completed a follow-up colonoscopy in April 2018 showing 1 6 mm polyp at the splenic flexure, benign, diverticulosis involving the rectosigmoid colon and sigmoid colon, small internal hemorrhoids.  Plans for surveillance colonoscopy in 5 years with family history of colon cancer in her father in his early 45s.  Patient reports several month history of intermittent left-sided abdominal pain.  Pain can start in the left lower quadrant and radiate all the way into the left upper quadrant.  Sometimes feels like significant pressure.  Has pain every day, some days worse than others.  The more active she is the more she notices the pain.  Feels similar to when she was in the hospital with diverticulitis.  Continues to have a bowel movement about 2-3 times per day.  Sometimes the stool is hard and she has to strain but majority of the time it is soft.  Denies any blood in the stool or melena.  She has intermittent heartburn with spicy foods.  Does not require any medication for her reflux.  Weight is up a few pounds from last year but overall stable.  She does question possibility of having some irritable bowel.  Her sister has IBS.   Current Outpatient Medications  Medication Sig Dispense Refill  . buPROPion (WELLBUTRIN XL) 150 MG 24 hr tablet Take 1 tablet by mouth daily.  11  . Cholecalciferol 5000 units capsule Take 1 capsule (5,000 Units total) by mouth daily.    Marland Kitchen escitalopram (LEXAPRO) 10 MG tablet TAKE 1 TABLET BY MOUTH EVERY DAY 90 tablet 1  . losartan-hydrochlorothiazide (HYZAAR)  50-12.5 MG tablet TAKE 1 TABLET BY MOUTH EVERY DAY 90 tablet 4  . valACYclovir (VALTREX) 1000 MG tablet Take 1 tablet (1,000 mg total) by mouth daily. (Patient taking differently: Take 1,000 mg by mouth as needed. ) 30 tablet prn   No current facility-administered medications for this visit.     Allergies as of 02/22/2018 - Review Complete 02/22/2018  Allergen Reaction Noted  . Erythromycin Other (See Comments) 07/12/2012  . Lisinopril Other (See Comments) 12/20/2016  . Phenergan [promethazine hcl]  07/19/2016    ROS:  General: Negative for anorexia, weight loss, fever, chills, fatigue, weakness. ENT: Negative for hoarseness, difficulty swallowing , nasal congestion. CV: Negative for chest pain, angina, palpitations, dyspnea on exertion, peripheral edema.  Respiratory: Negative for dyspnea at rest, dyspnea on exertion, cough, sputum, wheezing.  GI: See history of present illness. GU:  Negative for dysuria, hematuria, urinary incontinence, urinary frequency, nocturnal urination.  Endo: Negative for unusual weight change.    Physical Examination:   BP 138/67   Pulse 79   Temp 97.8 F (36.6 C) (Oral)   Ht 5\' 3"  (1.6 m)   Wt 169 lb 9.6 oz (76.9 kg)   BMI 30.04 kg/m   General: Well-nourished, well-developed in no acute distress.  Eyes: No icterus. Mouth: Oropharyngeal mucosa moist and pink , no lesions erythema or exudate. Lungs: Clear to auscultation bilaterally.  Heart: Regular rate and rhythm, no murmurs rubs or gallops.  Abdomen: Bowel sounds are normal, mild  left lower quadrant tenderness with tenderness noted in the left upper quadrant as well, nondistended, no hepatosplenomegaly or masses, no abdominal bruits or hernia , no rebound or guarding.   Extremities: No lower extremity edema. No clubbing or deformities. Neuro: Alert and oriented x 4   Skin: Warm and dry, no jaundice.   Psych: Alert and cooperative, normal mood and affect.

## 2018-02-22 NOTE — Assessment & Plan Note (Signed)
52 year old female presenting with several month history of left-sided abdominal pain extending from the left lower quadrant to the left upper quadrant.  She has a history of uncomplicated diverticulitis November 2017, hospitalized andwell-documented on CT.  Follow-up colonoscopy as outlined.  She reports daily pain, some days worse than others.  Bowel function unchanged.  Increased activity makes her symptoms worse, bowel movement sometimes helps pain. Consider smoldering diverticulitis vs IBS vs other. Recommend CT A/P with contrast. Check labs. Will add fiber supplement to help with stool consistency. Trial of bentyl for abdominal discomfort possibly related to IBS. Further recommendations to follow.

## 2018-02-22 NOTE — Patient Instructions (Signed)
1. Please have your labs done at least one day before your CT scan. 2. CT scan as scheduled.  3. Add fiber supplement such as Fiberchoice chew two daily or Benefiber powder two teaspoons daily.  4. Trial of Bentyl for abdominal discomfort. rx sent to your pharmacy.

## 2018-02-22 NOTE — Progress Notes (Signed)
CC'D TO PCP °

## 2018-02-22 NOTE — Telephone Encounter (Signed)
PA approved via [employer]'s website. Order ID: [id number] dates 02/22/18-03/23/18.

## 2018-02-22 NOTE — Telephone Encounter (Addendum)
Patient CT scheduled for 03/20/18 at 3:00pm, arrival time 2:45pm, npo 4 hrs prior, p/u oral contrast from [facility]. Needs to make sure lab work is done prior.   Called patient LMOVM. Appt letter mailed

## 2018-02-27 NOTE — Telephone Encounter (Signed)
Patient aware of appt details. 

## 2018-03-06 DIAGNOSIS — R109 Unspecified abdominal pain: Secondary | ICD-10-CM | POA: Diagnosis not present

## 2018-03-06 DIAGNOSIS — Z08 Encounter for follow-up examination after completed treatment for malignant neoplasm: Secondary | ICD-10-CM | POA: Diagnosis not present

## 2018-03-06 DIAGNOSIS — R1032 Left lower quadrant pain: Secondary | ICD-10-CM | POA: Diagnosis not present

## 2018-03-06 DIAGNOSIS — L570 Actinic keratosis: Secondary | ICD-10-CM | POA: Diagnosis not present

## 2018-03-06 DIAGNOSIS — Z85828 Personal history of other malignant neoplasm of skin: Secondary | ICD-10-CM | POA: Diagnosis not present

## 2018-03-06 DIAGNOSIS — R140 Abdominal distension (gaseous): Secondary | ICD-10-CM | POA: Diagnosis not present

## 2018-03-06 DIAGNOSIS — X32XXXD Exposure to sunlight, subsequent encounter: Secondary | ICD-10-CM | POA: Diagnosis not present

## 2018-03-07 LAB — COMPREHENSIVE METABOLIC PANEL
A/G RATIO: 2 (ref 1.2–2.2)
ALT: 28 IU/L (ref 0–32)
AST: 20 IU/L (ref 0–40)
Albumin: 4.6 g/dL (ref 3.5–5.5)
Alkaline Phosphatase: 95 IU/L (ref 39–117)
BUN/Creatinine Ratio: 11 (ref 9–23)
BUN: 11 mg/dL (ref 6–24)
Bilirubin Total: 0.4 mg/dL (ref 0.0–1.2)
CO2: 26 mmol/L (ref 20–29)
Calcium: 9.5 mg/dL (ref 8.7–10.2)
Chloride: 101 mmol/L (ref 96–106)
Creatinine, Ser: 0.96 mg/dL (ref 0.57–1.00)
GFR calc Af Amer: 78 mL/min/{1.73_m2} (ref >59)
GFR, EST NON AFRICAN AMERICAN: 67 mL/min/{1.73_m2} (ref >59)
GLOBULIN, TOTAL: 2.3 g/dL (ref 1.5–4.5)
GLUCOSE: 89 mg/dL (ref 65–99)
POTASSIUM: 3.7 mmol/L (ref 3.5–5.2)
SODIUM: 139 mmol/L (ref 134–144)
Total Protein: 6.9 g/dL (ref 6.0–8.5)

## 2018-03-07 LAB — CBC WITH DIFFERENTIAL/PLATELET
BASOS ABS: 0 10*3/uL (ref 0.0–0.2)
Basos: 1 %
EOS (ABSOLUTE): 0.1 10*3/uL (ref 0.0–0.4)
Eos: 1 %
Hematocrit: 45.9 % (ref 34.0–46.6)
Hemoglobin: 15.3 g/dL (ref 11.1–15.9)
Immature Grans (Abs): 0 10*3/uL (ref 0.0–0.1)
Immature Granulocytes: 0 %
LYMPHS ABS: 1.8 10*3/uL (ref 0.7–3.1)
Lymphs: 30 %
MCH: 28.6 pg (ref 26.6–33.0)
MCHC: 33.3 g/dL (ref 31.5–35.7)
MCV: 86 fL (ref 79–97)
MONOS ABS: 0.4 10*3/uL (ref 0.1–0.9)
Monocytes: 7 %
NEUTROS PCT: 61 %
Neutrophils Absolute: 3.6 10*3/uL (ref 1.4–7.0)
PLATELETS: 340 10*3/uL (ref 150–450)
RBC: 5.35 x10E6/uL — AB (ref 3.77–5.28)
RDW: 13.2 % (ref 12.3–15.4)
WBC: 5.9 10*3/uL (ref 3.4–10.8)

## 2018-03-07 LAB — LIPASE: Lipase: 26 U/L (ref 14–72)

## 2018-03-15 NOTE — Progress Notes (Signed)
PT is aware.

## 2018-03-20 ENCOUNTER — Ambulatory Visit (HOSPITAL_COMMUNITY): Payer: BLUE CROSS/BLUE SHIELD

## 2018-03-20 ENCOUNTER — Other Ambulatory Visit: Payer: BLUE CROSS/BLUE SHIELD | Admitting: Adult Health

## 2018-04-05 ENCOUNTER — Telehealth: Payer: Self-pay | Admitting: *Deleted

## 2018-04-05 NOTE — Telephone Encounter (Signed)
Received call from pre-service center. Patient rescheduled CT abd/pelvis outside of her authorization date from insurance. PA has also expired.   New PA done via [employer] website. Approved. Order ID [id number] dates 04/05/2018-05/04/2018

## 2018-04-10 ENCOUNTER — Ambulatory Visit (HOSPITAL_COMMUNITY): Admission: RE | Admit: 2018-04-10 | Payer: BLUE CROSS/BLUE SHIELD | Source: Ambulatory Visit

## 2018-04-24 ENCOUNTER — Encounter: Payer: Self-pay | Admitting: Adult Health

## 2018-04-24 ENCOUNTER — Other Ambulatory Visit (HOSPITAL_COMMUNITY)
Admission: RE | Admit: 2018-04-24 | Discharge: 2018-04-24 | Disposition: A | Discharge status: Home / Self Care | Payer: BLUE CROSS/BLUE SHIELD | Source: Ambulatory Visit | Attending: Adult Health | Admitting: Adult Health

## 2018-04-24 ENCOUNTER — Other Ambulatory Visit: Payer: Self-pay

## 2018-04-24 ENCOUNTER — Ambulatory Visit (INDEPENDENT_AMBULATORY_CARE_PROVIDER_SITE_OTHER): Payer: BLUE CROSS/BLUE SHIELD | Admitting: Adult Health

## 2018-04-24 VITALS — BP 144/81 | HR 75 | Resp 20 | Ht 63.0 in | Wt 172.0 lb

## 2018-04-24 DIAGNOSIS — Z1211 Encounter for screening for malignant neoplasm of colon: Secondary | ICD-10-CM | POA: Diagnosis not present

## 2018-04-24 DIAGNOSIS — Z01419 Encounter for gynecological examination (general) (routine) without abnormal findings: Secondary | ICD-10-CM | POA: Diagnosis not present

## 2018-04-24 DIAGNOSIS — I10 Essential (primary) hypertension: Secondary | ICD-10-CM

## 2018-04-24 DIAGNOSIS — Z1212 Encounter for screening for malignant neoplasm of rectum: Secondary | ICD-10-CM

## 2018-04-24 LAB — HEMOCCULT GUIAC POC 1CARD (OFFICE): Fecal Occult Blood, POC: NEGATIVE

## 2018-04-24 MED ORDER — LOSARTAN POTASSIUM-HCTZ 50-12.5 MG PO TABS: 1.0000 | ORAL_TABLET | Freq: Every day | ORAL | 4 refills | Status: DC

## 2018-04-24 NOTE — Progress Notes (Addendum)
Patient ID: Shannon Lindsey, female   DOB: Oct 28, 1965, 53 y.o.   MRN: [id number] History of Present Illness: Shannon Lindsey is a 53 year old white female, separated,  in for a well woman gyn exam and pap.No complaints, has seen Dr Oneida Alar recently, had pain left side, ?related to diverticulitis is better if does not eat spicy hot foods.  PCP is Dr Karie Kirks.    Current Medications, Allergies, Past Medical History, Past Surgical History, Family History and Social History were reviewed in [employer] record.     Review of Systems: Patient denies any headaches, hearing loss, fatigue, blurred vision, shortness of breath, chest pain, abdominal pain, problems with bowel movements, urination, or intercourse(not having sex). No joint pain or mood swings.    Physical Exam:BP (!) 144/81 (BP Location: Right Arm, Patient Position: Sitting, Cuff Size: Normal)   Pulse 75   Resp 20   Ht 5\' 3"  (1.6 m)   Wt 172 lb (78 kg)   BMI 30.47 kg/m  General:  Well developed, well nourished, no acute distress Skin:  Warm and dry Neck:  Midline trachea, normal thyroid, good ROM, no lymphadenopathy Lungs; Clear to auscultation bilaterally Breast:  No dominant palpable mass, retraction, or nipple discharge Cardiovascular: Regular rate and rhythm Abdomen:  Soft, non tender, no hepatosplenomegaly Pelvic:  External genitalia is normal in appearance, no lesions.  The vagina is pale with loss of moisture and rugae. Urethra has no lesions or masses. The cervix is smooth, pap with HPV performed.  Uterus is felt to be normal size, shape, and contour.  No adnexal masses or tenderness noted.Bladder is non tender, no masses felt. Rectal: Good sphincter tone, no polyps, or hemorrhoids felt.  Hemoccult negative. Extremities/musculoskeletal:  No swelling or varicosities noted, no clubbing or cyanosis Psych:  No mood changes, alert and cooperative,seems happy PHQ 2 score 0. Fall risk is low. Examination chaperoned by  Shela Nevin RN.  Impression: 1. Encounter for gynecological examination with Papanicolaou smear of cervix   2. Screening for colorectal cancer   3. Essential hypertension       Plan: Meds ordered this encounter  Medications  . losartan-hydrochlorothiazide (HYZAAR) 50-12.5 MG tablet    Sig: Take 1 tablet by mouth daily.    Dispense:  90 tablet    Refill:  4    Order Specific Question:   Supervising Provider    Answer:   Florian Buff [[id number]]  Physical in 1 year Mammogram yearly Pap in 3 if normal Colonoscopy per GI  F/U with PCP

## 2018-04-26 LAB — CYTOLOGY - PAP
Adequacy: ABSENT
Diagnosis: NEGATIVE
HPV: NOT DETECTED

## 2018-06-01 ENCOUNTER — Other Ambulatory Visit (HOSPITAL_COMMUNITY): Payer: Self-pay | Admitting: Obstetrics and Gynecology

## 2018-06-01 ENCOUNTER — Telehealth: Payer: Self-pay | Admitting: Gastroenterology

## 2018-06-01 DIAGNOSIS — Z1231 Encounter for screening mammogram for malignant neoplasm of breast: Secondary | ICD-10-CM

## 2018-06-01 NOTE — Telephone Encounter (Addendum)
Shannon Lindsey, her original CT order was done in November 2019. She has rescheduled several times and we had to redo her PA x 2. Does she need OV to reschedule CT?

## 2018-06-01 NOTE — Telephone Encounter (Signed)
[phone] patient was told the referral was expired for her ct scan

## 2018-06-01 NOTE — Telephone Encounter (Signed)
Tried to call pt, no answer, LMOVM to inform her she will need OV.

## 2018-06-01 NOTE — Telephone Encounter (Signed)
I agree. We have not seen her for 3 months and we were doing CT for abdominal pain. In order to redo PA we need updated OV to determine necessity of CT.

## 2018-06-06 NOTE — Telephone Encounter (Signed)
The patient is scheduled to see LL on 5/11 at 8 am and she is aware of the appt

## 2018-06-19 ENCOUNTER — Ambulatory Visit (HOSPITAL_COMMUNITY): Payer: BLUE CROSS/BLUE SHIELD

## 2018-07-17 ENCOUNTER — Ambulatory Visit (HOSPITAL_COMMUNITY): Payer: BLUE CROSS/BLUE SHIELD

## 2018-07-20 ENCOUNTER — Ambulatory Visit (HOSPITAL_COMMUNITY)
Admission: EM | Admit: 2018-07-20 | Discharge: 2018-07-20 | Disposition: A | Discharge status: Home / Self Care | Payer: BLUE CROSS/BLUE SHIELD | Attending: Internal Medicine | Admitting: Internal Medicine

## 2018-07-20 ENCOUNTER — Other Ambulatory Visit: Payer: Self-pay

## 2018-07-20 ENCOUNTER — Encounter (HOSPITAL_COMMUNITY): Payer: Self-pay

## 2018-07-20 DIAGNOSIS — R0789 Other chest pain: Secondary | ICD-10-CM

## 2018-07-20 NOTE — ED Notes (Signed)
Patient verbalizes understanding of discharge instructions. Opportunity for questioning and answers were provided. Patient discharged from [facility] by MD. 

## 2018-07-20 NOTE — ED Provider Notes (Signed)
[place]    CSN: [id number] Arrival date & time: 07/20/18  1652     History   Chief Complaint Chief Complaint  Patient presents with  . chest discomfort    HPI Shannon Lindsey is a 53 y.o. female with a history of hypertension-controlled with medications, depression, controlled comes to urgent care with complaints of fleeting sharp chest pain in the left breast area.  Patient says that there is no pattern to the pain.  Pain lasts a few seconds.  Most of the time is just a sensation and occasionally she gets sharp shooting pain.  No aggravating or relieving factors.  No diaphoresis, near syncope or syncopal episodes.  No nausea vomiting.  It is not associated with any shortness of breath, cough or sputum production.  No trauma to the chest.  HPI  Past Medical History:  Diagnosis Date  . Abnormal Pap smear   . Burning with urination 09/29/2015  . Cancer (HCC)    squamous cell chest  . Carpal tunnel syndrome, bilateral 09/29/2015  . Diverticulitis   . H/O hiatal hernia   . HSV-1 (herpes simplex virus 1) infection   . HSV-2 (herpes simplex virus 2) infection   . Hyperlipidemia   . Hypertension   . Vaginal dryness 07/17/2014  . Vaginal Pap smear, abnormal   . Vitamin D deficiency 09/09/2015  . Warts, genital     Patient Active Problem List   Diagnosis Date Noted  . Left sided abdominal pain 02/22/2018  . Abdominal bloating 02/22/2018  . Encounter for well woman exam with routine gynecological exam 12/20/2016  . Screening cholesterol level 12/20/2016  . Skin tag of vulva 12/20/2016  . Family history of colon cancer 06/07/2016  . Acute diverticulitis 02/26/2016    Class: History of  . Abdominal pain 02/26/2016  . Burning with urination 09/29/2015  . Carpal tunnel syndrome, bilateral 09/29/2015  . Vitamin D deficiency 09/09/2015  . Vaginal dryness 07/17/2014  . Indigestion 07/17/2014  . Squamous cell carcinoma of skin 12/15/2012  . Warts, genital   . HTN  (hypertension) 07/11/2012  . HSV-2 (herpes simplex virus 2) infection 07/11/2012  . HEMOCCULT POSITIVE STOOL 09/16/2009  . EPIGASTRIC PAIN 09/15/2009    Past Surgical History:  Procedure Laterality Date  . CESAREAN SECTION    . COLONOSCOPY N/A 07/19/2016   Procedure: COLONOSCOPY;  Surgeon: Danie Binder, MD;  Location: [facility];  Service: Endoscopy;  Laterality: N/A;  10:30 - moved to 4/23 @ 8:30, per [place]  . COLONOSCOPY WITH ESOPHAGOGASTRODUODENOSCOPY (EGD)  2011   Dr. Oneida Alar: small hh, gastritis bx negative for h.pylori, diverticulosis of colon, two hyperplastic colon polyps removed. TCS in 5 years due to father having colon cancer at age 16  . COLPOSCOPY W/ BIOPSY / CURETTAGE    . EXPLORATORY LAPAROTOMY    . MELANOMA EXCISION Right 12/25/2012   Procedure: wide EXCISION squamous cell carcinoma right chest ;  Surgeon: Zenovia Jarred, MD;  Location: [place];  Service: General;  Laterality: Right;  . MOLE REMOVAL     has had 30 + removed not cancer  . TONSILLECTOMY    . TUBAL LIGATION      OB History    Gravida  2   Para  2   Term  2   Preterm      AB      Living  2     SAB      TAB      Ectopic  Multiple      Live Births  2            Home Medications    Prior to Admission medications   Medication Sig Start Date End Date Taking? Authorizing Provider  buPROPion (WELLBUTRIN XL) 150 MG 24 hr tablet Take 1 tablet by mouth daily. 01/31/18   [provider]  Cholecalciferol 5000 units capsule Take 1 capsule (5,000 Units total) by mouth daily. 09/09/15   Estill Dooms, NP  losartan-hydrochlorothiazide (HYZAAR) 50-12.5 MG tablet Take 1 tablet by mouth daily. 04/24/18   Estill Dooms, NP  valACYclovir (VALTREX) 1000 MG tablet Take 1 tablet (1,000 mg total) by mouth daily. Patient taking differently: Take 1,000 mg by mouth as needed.  01/25/18   Estill Dooms, NP    Family History Family History  Problem Relation Age of Onset   . Cancer Father        colon and lung  . COPD Father   . Colon cancer Father 69  . Atrial fibrillation Mother   . Diabetes Mother   . Hyperlipidemia Mother   . Hypertension Mother   . Stroke Sister   . Heart attack Brother   . Other Daughter        benign pituitary tumor  . Ovarian cancer Maternal Aunt     Social History Social History   Tobacco Use  . Smoking status: Never Smoker  . Smokeless tobacco: Never Used  Substance Use Topics  . Alcohol use: Yes    Comment: occ.  . Drug use: No     Allergies   Erythromycin; Lisinopril; and Phenergan [promethazine hcl]   Review of Systems Review of Systems  Constitutional: Negative for activity change, appetite change, chills, fatigue and fever.  HENT: Negative.   Respiratory: Negative for apnea, cough, shortness of breath and wheezing.   Cardiovascular: Positive for chest pain. Negative for palpitations.  Gastrointestinal: Negative for abdominal distention, abdominal pain, diarrhea and nausea.  Genitourinary: Negative.   Musculoskeletal: Negative for arthralgias, gait problem, joint swelling and myalgias.  Neurological: Negative.   All other systems reviewed and are negative.    Physical Exam Triage Vital Signs ED Triage Vitals  Enc Vitals Group     BP 07/20/18 1714 121/71     Pulse Rate 07/20/18 1714 79     Resp 07/20/18 1714 17     Temp 07/20/18 1714 98.4 F (36.9 C)     Temp Source 07/20/18 1714 Oral     SpO2 07/20/18 1714 100 %     Weight --      Height --      Head Circumference --      Peak Flow --      Pain Score 07/20/18 1712 1     Pain Loc --      Pain Edu? --      Excl. in [place]? --    No data found.  Updated Vital Signs BP 121/71 (BP Location: Right Arm)   Pulse 79   Temp 98.4 F (36.9 C) (Oral)   Resp 17   SpO2 100%   Visual Acuity Right Eye Distance:   Left Eye Distance:   Bilateral Distance:    Right Eye Near:   Left Eye Near:    Bilateral Near:     Physical Exam  Constitutional:      General: She is not in acute distress.    Appearance: Normal appearance. She is not ill-appearing.  HENT:  Nose: Nose normal.  Eyes:     Conjunctiva/sclera: Conjunctivae normal.  Neck:     Musculoskeletal: Normal range of motion.  Cardiovascular:     Rate and Rhythm: Normal rate and regular rhythm.  Pulmonary:     Effort: Pulmonary effort is normal.     Breath sounds: Normal breath sounds.  Abdominal:     General: Bowel sounds are normal. There is no distension.     Palpations: Abdomen is soft. There is no mass.     Tenderness: There is no abdominal tenderness.  Musculoskeletal: Normal range of motion.        General: No swelling or tenderness.  Skin:    General: Skin is warm.     Capillary Refill: Capillary refill takes less than 2 seconds.  Neurological:     General: No focal deficit present.     Mental Status: She is alert and oriented to person, place, and time.      UC Treatments / Results  Labs (all labs ordered are listed, but only abnormal results are displayed) Labs Reviewed - No data to display  EKG None  Radiology No results found.  Procedures Procedures (including critical care time)  Medications Ordered in UC Medications - No data to display  Initial Impression / Assessment and Plan / UC Course  I have reviewed the triage vital signs and the nursing notes.  Pertinent labs & imaging results that were available during my care of the patient were reviewed by me and considered in my medical decision making (see chart for details).     1. Atypical Chest pain: EKG shows NSR Chest pain/ pressure is non-cardiac.  Reassurance given to patient. Final Clinical Impressions(s) / UC Diagnoses   Final diagnoses:  None   Discharge Instructions   None    ED Prescriptions    None     Controlled Substance Prescriptions [place] Controlled Substance Registry consulted? No   Chase Picket, MD 07/24/18 1104

## 2018-07-20 NOTE — ED Triage Notes (Addendum)
Patient presents to Urgent Care with complaints of left chest discomfort which comes and goes under her left breast since a few months ago. Patient states she has had a lot of stress at home, taking care of her mother.

## 2018-08-07 ENCOUNTER — Other Ambulatory Visit: Payer: Self-pay

## 2018-08-07 ENCOUNTER — Encounter

## 2018-08-07 ENCOUNTER — Other Ambulatory Visit (HOSPITAL_COMMUNITY)
Admission: RE | Admit: 2018-08-07 | Discharge: 2018-08-07 | Disposition: A | Discharge status: Home / Self Care | Payer: BLUE CROSS/BLUE SHIELD | Source: Ambulatory Visit | Attending: Gastroenterology | Admitting: Gastroenterology

## 2018-08-07 ENCOUNTER — Telehealth: Payer: Self-pay

## 2018-08-07 ENCOUNTER — Encounter: Payer: Self-pay | Admitting: Gastroenterology

## 2018-08-07 ENCOUNTER — Ambulatory Visit (INDEPENDENT_AMBULATORY_CARE_PROVIDER_SITE_OTHER): Payer: BLUE CROSS/BLUE SHIELD | Admitting: Gastroenterology

## 2018-08-07 ENCOUNTER — Ambulatory Visit (HOSPITAL_COMMUNITY)
Admission: RE | Admit: 2018-08-07 | Discharge: 2018-08-07 | Disposition: A | Discharge status: Home / Self Care | Payer: BLUE CROSS/BLUE SHIELD | Source: Ambulatory Visit | Attending: Obstetrics and Gynecology | Admitting: Obstetrics and Gynecology

## 2018-08-07 DIAGNOSIS — Z1231 Encounter for screening mammogram for malignant neoplasm of breast: Secondary | ICD-10-CM | POA: Diagnosis not present

## 2018-08-07 DIAGNOSIS — R1032 Left lower quadrant pain: Secondary | ICD-10-CM

## 2018-08-07 LAB — BASIC METABOLIC PANEL
Anion gap: 9 (ref 5–15)
BUN: 15 mg/dL (ref 6–20)
CO2: 25 mmol/L (ref 22–32)
Calcium: 8.9 mg/dL (ref 8.9–10.3)
Chloride: 103 mmol/L (ref 98–111)
Creatinine, Ser: 0.71 mg/dL (ref 0.44–1.00)
GFR calc Af Amer: >60 mL/min (ref >60)
GFR calc non Af Amer: >60 mL/min (ref >60)
Glucose, Bld: 99 mg/dL (ref 70–99)
Potassium: 3.9 mmol/L (ref 3.5–5.1)
Sodium: 137 mmol/L (ref 135–145)

## 2018-08-07 NOTE — Telephone Encounter (Signed)
CT abd/pelvis w/contrast scheduled for 5/26 but pt requested appt on a Monday.  CT rescheduled to 08/14/18 at 8:00am, arrive at 7:45am. NPO 4 hours prior to test. Pick up contrast and have labs done prior to CT. Called and informed pt of appt, BMET order faxed to [facility] lab (pt aware). Appt letter mailed.

## 2018-08-07 NOTE — Patient Instructions (Signed)
1. Please go to the lab at least the day before your CT for blood work to check your kidney function.  This is necessary in order to give you contrast for your CT scan. 2. CT scan of your abdomen to further evaluate your abdominal pain.

## 2018-08-07 NOTE — Progress Notes (Signed)
Primary Care Physician:  Lemmie Evens, MD Primary GI:  Barney Drain, MD   Patient Location: Home  Provider Location: [facility] office  Reason for Visit: abdominal pain  Persons present on the virtual encounter, with roles: Patient, myself (provider),Martina Darrick Grinder, LPN(updated meds and allergies)  Total time (minutes) spent on medical discussion: 20 minutes  Due to COVID-19, visit was conducted using [email or web address] method.  Visit was requested by patient.  Virtual Visit via [email or web address]  I connected with Marshe Shrestha Odette on 08/07/18 at  8:00 AM EDT by [email or web address] and verified that I am speaking with the correct person using two identifiers.   I discussed the limitations, risks, security and privacy concerns of performing an evaluation and management service by telephone/video and the availability of in person appointments. I also discussed with the patient that there may be a patient responsible charge related to this service. The patient expressed understanding and agreed to proceed.   HPI:   Shannon Lindsey is a 53 y.o. female who presents for virtual visit regarding abdominal pain.  Patient was last seen in November 2019.  She was having left-sided abdominal pain at that time.  History of uncomplicated acute diverticulitis with hospitalization in November 2017.  She had a colonoscopy in April 2018 showing 6 mm polyp in the splenic flexure, benign, diverticulosis involving the rectosigmoid colon and sigmoid colon, small internal hemorrhoids.  Plans for surveillance colonoscopy in April 2023.  At that time we ordered a CT abdomen pelvis to further evaluate her abdominal pain but she never followed through.  She did her labs which were unremarkable.  Never able to get CT scan due to problems done at work. LLQ pain every day. Comes and goes. Happens before BMs and with certain foods, especially spicy foods.  Pain is not severe, dull ache and aggravating. Likes hot sauce on everything. Eating a lot of  salads, gets gas. No n/v. No fever. No blood in stool or melena. No heartburn lately. Lost about 10 pounds intentionally and reflux is a lot better.   Current Outpatient Medications  Medication Sig Dispense Refill  . buPROPion (WELLBUTRIN XL) 150 MG 24 hr tablet Take 1 tablet by mouth daily.  11  . Cholecalciferol 5000 units capsule Take 1 capsule (5,000 Units total) by mouth daily.    Marland Kitchen losartan-hydrochlorothiazide (HYZAAR) 50-12.5 MG tablet Take 1 tablet by mouth daily. 90 tablet 4  . valACYclovir (VALTREX) 1000 MG tablet Take 1 tablet (1,000 mg total) by mouth daily. (Patient taking differently: Take 1,000 mg by mouth as needed. ) 30 tablet prn   No current facility-administered medications for this visit.     ROS:  General: Negative for anorexia, unintentional weight loss, fever, chills, fatigue, weakness. Eyes: Negative for vision changes.  ENT: Negative for hoarseness, difficulty swallowing , nasal congestion. CV: Negative for chest pain, angina, palpitations, dyspnea on exertion, peripheral edema.  Respiratory: Negative for dyspnea at rest, dyspnea on exertion, cough, sputum, wheezing.  GI: See history of present illness. GU:  Negative for dysuria, hematuria, urinary incontinence, urinary frequency, nocturnal urination.  MS: Negative for joint pain, low back pain.  Derm: Negative for rash or itching.  Neuro: Negative for weakness, abnormal sensation, seizure, frequent headaches, memory loss, confusion.  Psych: Negative for anxiety, depression, suicidal ideation, hallucinations.  Endo: Negative for unusual weight change.  Heme: Negative for bruising or bleeding. Allergy: Negative for rash or hives.   Observations/Objective: Well-nourished well-developed Caucasian female in no acute distress.  Otherwise exam unavailable.  Lab Results  Component Value Date   CREATININE 0.96 03/06/2018   BUN 11 03/06/2018   NA 139 03/06/2018   K 3.7 03/06/2018   CL 101 03/06/2018   CO2 26  03/06/2018   Lab Results  Component Value Date   WBC 5.9 03/06/2018   HGB 15.3 03/06/2018   HCT 45.9 03/06/2018   MCV 86 03/06/2018   PLT 340 03/06/2018   Lab Results  Component Value Date   ALT 28 03/06/2018   AST 20 03/06/2018   ALKPHOS 95 03/06/2018   BILITOT 0.4 03/06/2018   Lab Results  Component Value Date   LIPASE 26 03/06/2018     Assessment and Plan: Pleasant 53 year old female with history of diverticulitis, family history of colon cancer who presents with ongoing daily intermittent left lower quadrant pain.  Her last colonoscopy was over 2 years ago.  Her last CT was in 2017 at time of diverticulitis.  Her bowel function is regular.  Given ongoing abdominal pain, offer her CT abdomen pelvis with contrast to rule out diverticulitis, complications of diverticulitis, less likely malignancy.  Follow Up Instructions:    I discussed the assessment and treatment plan with the patient. The patient was provided an opportunity to ask questions and all were answered. The patient agreed with the plan and demonstrated an understanding of the instructions. AVS mailed to patient's home address.   The patient was advised to call back or seek an in-person evaluation if the symptoms worsen or if the condition fails to improve as anticipated.  I provided 20 minutes of virtual face-to-face time during this encounter.   Neil Crouch, PA-C

## 2018-08-07 NOTE — Progress Notes (Signed)
CC'D TO PCP °

## 2018-08-07 NOTE — Telephone Encounter (Signed)
PA for CT submitted via [employer]. Case approved. Order ID: [id number], valid 08/07/18-02/02/19.

## 2018-08-08 ENCOUNTER — Telehealth: Payer: Self-pay | Admitting: *Deleted

## 2018-08-08 NOTE — Telephone Encounter (Signed)
As far as imaging modalities, there are no other "cheaper" alternatives that would provide adequate imaging of her colon. If she prefers to hold off on imaging right now, I'm okay with it but if we cannot get symptoms under control then she would really need to consider CT.   We can try an antispasmotic that I discussed with her yesterday that sometimes helps patients who have pain from the diverticula pockets. Sometimes people can have intermittent abdominal pain but not have active infection (diverticulitis).  I would also like for her to cut back on her hot sauce as she reports that her symptoms are worse with that.   Let me know what she would like to do.

## 2018-08-08 NOTE — Telephone Encounter (Signed)
Spoke with pt. She has a deductible of $3,000. She isn't able to afford $2500 for the CT and wants to know if there is something else she can do? Pt was given this quote by [facility].

## 2018-08-08 NOTE — Telephone Encounter (Signed)
As before labs were ok, specifically for renal function for the CT.   If she wants to try antispasmotic let me know. We would try bentyl.

## 2018-08-08 NOTE — Telephone Encounter (Signed)
Pt says that we ordered CT scan and it is going to cost her $2500.  She can't afford it.  Can we recommend something else?  Pt says she seen Magda Paganini last.  [phone]

## 2018-08-08 NOTE — Telephone Encounter (Signed)
Spoke with pt and she would like to hold off for right now. She's going to d/c hot sauce and wait on her lab results. Pt will let LSL know if she continues to have problems.

## 2018-08-09 MED ORDER — DICYCLOMINE HCL 10 MG PO CAPS: ORAL_CAPSULE | ORAL | 1 refills | Status: DC

## 2018-08-09 NOTE — Addendum Note (Signed)
Addended by: Mahala Menghini on: 08/09/2018 12:05 PM   Modules accepted: Orders

## 2018-08-09 NOTE — Telephone Encounter (Signed)
Noted, pt is aware 

## 2018-08-09 NOTE — Telephone Encounter (Signed)
rx done

## 2018-08-09 NOTE — Telephone Encounter (Signed)
Spoke with pt and she has decided to try the Bentyl.

## 2018-08-14 ENCOUNTER — Ambulatory Visit (HOSPITAL_COMMUNITY): Payer: BLUE CROSS/BLUE SHIELD

## 2018-08-22 ENCOUNTER — Telehealth: Payer: Self-pay | Admitting: Gastroenterology

## 2018-08-22 ENCOUNTER — Other Ambulatory Visit: Payer: Self-pay

## 2018-08-22 ENCOUNTER — Ambulatory Visit (HOSPITAL_COMMUNITY): Payer: BLUE CROSS/BLUE SHIELD

## 2018-08-22 DIAGNOSIS — R1032 Left lower quadrant pain: Secondary | ICD-10-CM

## 2018-08-22 NOTE — Telephone Encounter (Signed)
noted 

## 2018-08-22 NOTE — Telephone Encounter (Signed)
Called and informed pt that GI will call her to schedule appt.

## 2018-08-22 NOTE — Telephone Encounter (Signed)
Servicing Provider updated on [employer] for [employer] as pt requested to have CT done at [facility]. Order ID: [id number]. Valid 08/07/18-02/02/19.  New order entered for CT abd/pelvis at GI. Called GI and informed Manus Gunning of the order. GI will contact pt to schedule appt.  FYI to LSL.

## 2018-08-22 NOTE — Telephone Encounter (Signed)
[phone] patient called and said that her insurance told her that she can have her CT scan done cheaper at [facility] instead of [facility].  She would like the referral to go there, please call her because certain days would be better

## 2018-09-05 ENCOUNTER — Telehealth: Payer: Self-pay | Admitting: Gastroenterology

## 2018-09-05 NOTE — Telephone Encounter (Signed)
Advised patient we informed GI order was in on 5/26. Patient states she has not heard from anyone.  I called GI and spoke with Mardene Celeste. She apologized patient hadn't been called and states she will do so now.

## 2018-09-05 NOTE — Telephone Encounter (Signed)
Pt called to say that we were going to schedule her CT to be done in [place] and she hasn't heard from anyone and was following up. [phone]

## 2018-09-25 ENCOUNTER — Other Ambulatory Visit: Payer: Self-pay

## 2018-09-25 ENCOUNTER — Ambulatory Visit
Admission: RE | Admit: 2018-09-25 | Discharge: 2018-09-25 | Disposition: A | Discharge status: Home / Self Care | Payer: BC Managed Care – PPO | Source: Ambulatory Visit | Attending: Gastroenterology | Admitting: Gastroenterology

## 2018-09-25 DIAGNOSIS — K5730 Diverticulosis of large intestine without perforation or abscess without bleeding: Secondary | ICD-10-CM | POA: Diagnosis not present

## 2018-09-25 DIAGNOSIS — R1032 Left lower quadrant pain: Secondary | ICD-10-CM

## 2018-09-25 DIAGNOSIS — K449 Diaphragmatic hernia without obstruction or gangrene: Secondary | ICD-10-CM | POA: Diagnosis not present

## 2018-09-25 MED ORDER — IOPAMIDOL (ISOVUE-300) INJECTION 61%
100.0000 mL | Freq: Once | INTRAVENOUS | Status: AC | PRN
  Administered 2018-09-25: 11:00:00 100 mL via INTRAVENOUS

## 2018-09-26 ENCOUNTER — Telehealth: Payer: Self-pay | Admitting: Gastroenterology

## 2018-09-26 NOTE — Telephone Encounter (Signed)
LMOM for a return call.  

## 2018-09-26 NOTE — Telephone Encounter (Signed)
Pt had a CT yesterday and was calling for her results. I told her it normally takes 7-10 business days but I would let the nurse know that she had called. [phone]

## 2018-10-02 ENCOUNTER — Ambulatory Visit
Admission: EM | Admit: 2018-10-02 | Discharge: 2018-10-02 | Disposition: A | Discharge status: Home / Self Care | Payer: BC Managed Care – PPO | Attending: Physician Assistant | Admitting: Physician Assistant

## 2018-10-02 ENCOUNTER — Other Ambulatory Visit: Payer: Self-pay

## 2018-10-02 DIAGNOSIS — W57XXXA Bitten or stung by nonvenomous insect and other nonvenomous arthropods, initial encounter: Secondary | ICD-10-CM

## 2018-10-02 DIAGNOSIS — M791 Myalgia, unspecified site: Secondary | ICD-10-CM | POA: Diagnosis not present

## 2018-10-02 MED ORDER — DOXYCYCLINE HYCLATE 100 MG PO TABS: 100.0000 mg | ORAL_TABLET | Freq: Two times a day (BID) | ORAL | 0 refills | Status: DC

## 2018-10-02 NOTE — Telephone Encounter (Signed)
noted 

## 2018-10-02 NOTE — Telephone Encounter (Signed)
I did a result note last night. Please see instructions.

## 2018-10-02 NOTE — Telephone Encounter (Signed)
Forwarding to Leslie Lewis, PA for results.  

## 2018-10-02 NOTE — Telephone Encounter (Signed)
PT just called and was informed of results. She will call if she has any problems, she mostly just feels a little something on the left side after she eats something spicy.

## 2018-10-02 NOTE — ED Provider Notes (Addendum)
[facility]    CSN: [id number] Arrival date & time: 10/02/18  1648      History   Chief Complaint Chief Complaint  Patient presents with  . Insect Bite    HPI Shannon Lindsey is a 53 y.o. female.   The history is provided by the patient. No language interpreter was used.  Muscle Pain This is a new problem. The problem occurs constantly. The problem has not changed since onset.Nothing aggravates the symptoms. Nothing relieves the symptoms. She has tried nothing for the symptoms. The treatment provided mild relief.   Pt reports she is concerned about lyme disease.  Pt reports she had a check up this year with normal blood work  Past Medical History:  Diagnosis Date  . Abnormal Pap smear   . Burning with urination 09/29/2015  . Cancer (HCC)    squamous cell chest  . Carpal tunnel syndrome, bilateral 09/29/2015  . Diverticulitis   . H/O hiatal hernia   . HSV-1 (herpes simplex virus 1) infection   . HSV-2 (herpes simplex virus 2) infection   . Hyperlipidemia   . Hypertension   . Vaginal dryness 07/17/2014  . Vaginal Pap smear, abnormal   . Vitamin D deficiency 09/09/2015  . Warts, genital     Patient Active Problem List   Diagnosis Date Noted  . Abdominal pain, left lower quadrant 02/22/2018  . Abdominal bloating 02/22/2018  . Encounter for well woman exam with routine gynecological exam 12/20/2016  . Screening cholesterol level 12/20/2016  . Skin tag of vulva 12/20/2016  . Family history of colon cancer 06/07/2016  . Acute diverticulitis 02/26/2016    Class: History of  . Abdominal pain 02/26/2016  . Burning with urination 09/29/2015  . Carpal tunnel syndrome, bilateral 09/29/2015  . Vitamin D deficiency 09/09/2015  . Vaginal dryness 07/17/2014  . Indigestion 07/17/2014  . Squamous cell carcinoma of skin 12/15/2012  . Warts, genital   . HTN (hypertension) 07/11/2012  . HSV-2 (herpes simplex virus 2) infection 07/11/2012  . HEMOCCULT POSITIVE STOOL  09/16/2009  . EPIGASTRIC PAIN 09/15/2009    Past Surgical History:  Procedure Laterality Date  . CESAREAN SECTION    . COLONOSCOPY N/A 07/19/2016   Dr. Oneida Alar: 6 mm polyp, benign, diverticulosis involving rectosigmoid colon and sigmoid colon, small internal hemorrhoids.  Due to family history of colon cancer in a first-degree relative at early age, plans for next colonoscopy 5 years.  . COLONOSCOPY WITH ESOPHAGOGASTRODUODENOSCOPY (EGD)  2011   Dr. Oneida Alar: small hh, gastritis bx negative for h.pylori, diverticulosis of colon, two hyperplastic colon polyps removed. TCS in 5 years due to father having colon cancer at age 21  . COLPOSCOPY W/ BIOPSY / CURETTAGE    . EXPLORATORY LAPAROTOMY    . MELANOMA EXCISION Right 12/25/2012   Procedure: wide EXCISION squamous cell carcinoma right chest ;  Surgeon: Zenovia Jarred, MD;  Location: [facility];  Service: General;  Laterality: Right;  . MOLE REMOVAL     has had 30 + removed not cancer  . TONSILLECTOMY    . TUBAL LIGATION      OB History    Gravida  2   Para  2   Term  2   Preterm      AB      Living  2     SAB      TAB      Ectopic      Multiple      Live  Births  2            Home Medications    Prior to Admission medications   Medication Sig Start Date End Date Taking? Authorizing Provider  buPROPion (WELLBUTRIN XL) 150 MG 24 hr tablet Take 1 tablet by mouth daily. 01/31/18   [provider]  Cholecalciferol 5000 units capsule Take 1 capsule (5,000 Units total) by mouth daily. 09/09/15   Estill Dooms, NP  dicyclomine (BENTYL) 10 MG capsule Take one cap before meals and at bedtime as needed for abdominal pain/cramping 08/09/18   Mahala Menghini, PA-C  doxycycline (VIBRA-TABS) 100 MG tablet Take 1 tablet (100 mg total) by mouth 2 (two) times daily. 10/02/18   Fransico Meadow, PA-C  losartan-hydrochlorothiazide (HYZAAR) 50-12.5 MG tablet Take 1 tablet by mouth daily. 04/24/18   Estill Dooms, NP   valACYclovir (VALTREX) 1000 MG tablet Take 1 tablet (1,000 mg total) by mouth daily. Patient taking differently: Take 1,000 mg by mouth as needed.  01/25/18   Estill Dooms, NP    Family History Family History  Problem Relation Age of Onset  . Cancer Father        colon and lung  . COPD Father   . Colon cancer Father 62  . Atrial fibrillation Mother   . Diabetes Mother   . Hyperlipidemia Mother   . Hypertension Mother   . Stroke Sister   . Heart attack Brother   . Other Daughter        benign pituitary tumor  . Ovarian cancer Maternal Aunt     Social History Social History   Tobacco Use  . Smoking status: Never Smoker  . Smokeless tobacco: Never Used  Substance Use Topics  . Alcohol use: Yes    Comment: occ.  . Drug use: No     Allergies   Erythromycin, Lisinopril, and Phenergan [promethazine hcl]   Review of Systems Review of Systems  All other systems reviewed and are negative.    Physical Exam Triage Vital Signs ED Triage Vitals  Enc Vitals Group     BP 10/02/18 1656 (!) 146/80     Pulse Rate 10/02/18 1656 79     Resp 10/02/18 1656 16     Temp 10/02/18 1656 98.4 F (36.9 C)     Temp Source 10/02/18 1656 Oral     SpO2 10/02/18 1656 98 %     Weight --      Height --      Head Circumference --      Peak Flow --      Pain Score 10/02/18 1704 5     Pain Loc --      Pain Edu? --      Excl. in [place]? --    No data found.  Updated Vital Signs BP (!) 146/80 (BP Location: Left Arm)   Pulse 79   Temp 98.4 F (36.9 C) (Oral)   Resp 16   SpO2 98%   Visual Acuity Right Eye Distance:   Left Eye Distance:   Bilateral Distance:    Right Eye Near:   Left Eye Near:    Bilateral Near:     Physical Exam Vitals signs and nursing note reviewed.  Constitutional:      Appearance: She is well-developed.  HENT:     Head: Normocephalic.     Nose: Nose normal.     Mouth/Throat:     Mouth: Mucous membranes are moist.  Eyes:  Pupils: Pupils are  equal, round, and reactive to light.  Neck:     Musculoskeletal: Normal range of motion.  Cardiovascular:     Rate and Rhythm: Normal rate.  Pulmonary:     Effort: Pulmonary effort is normal.  Abdominal:     General: There is no distension.  Musculoskeletal: Normal range of motion.  Skin:    General: Skin is warm.  Neurological:     General: No focal deficit present.     Mental Status: She is alert and oriented to person, place, and time.  Psychiatric:        Mood and Affect: Mood normal.      UC Treatments / Results  Labs (all labs ordered are listed, but only abnormal results are displayed) Labs Reviewed - No data to display  EKG   Radiology No results found.  Procedures Procedures (including critical care time)  Medications Ordered in UC Medications - No data to display  Initial Impression / Assessment and Plan / UC Course  I have reviewed the triage vital signs and the nursing notes.  Pertinent labs & imaging results that were available during my care of the patient were reviewed by me and considered in my medical decision making (see chart for details).      Final Clinical Impressions(s) / UC Diagnoses   Final diagnoses:  Myalgia  Tick bite, initial encounter     Discharge Instructions     See Dr. Karie Kirks for recheck and lyme titer next week    ED Prescriptions    Medication Sig Dispense Auth. Provider   doxycycline (VIBRA-TABS) 100 MG tablet Take 1 tablet (100 mg total) by mouth 2 (two) times daily. 20 tablet Fransico Meadow, [place]     Controlled Substance Prescriptions [place] Controlled Substance Registry consulted? Not Applicable   Sidney Ace 10/02/18 1733    Fransico Meadow, PA-C 10/02/18 1739

## 2018-10-02 NOTE — ED Triage Notes (Signed)
Pt  States that she has had 3 tick bites. Last one was 2 weeks ago, pt is now having muscle and joint pain with fatigue

## 2018-10-02 NOTE — Discharge Instructions (Addendum)
See Dr. Karie Kirks for recheck and lyme titer next week

## 2018-10-03 ENCOUNTER — Telehealth: Payer: Self-pay | Admitting: Gastroenterology

## 2018-10-03 NOTE — Telephone Encounter (Signed)
PATIENT RETURNED CALL, PLEASE CALL BACK  °

## 2018-10-04 ENCOUNTER — Telehealth: Payer: Self-pay | Admitting: General Practice

## 2018-10-04 DIAGNOSIS — Z20822 Contact with and (suspected) exposure to covid-19: Secondary | ICD-10-CM

## 2018-10-04 NOTE — Addendum Note (Signed)
Addended by: Dimple Nanas on: 10/04/2018 04:34 PM   Modules accepted: Orders

## 2018-10-04 NOTE — Telephone Encounter (Signed)
Pt has been scheduled for covid testing.  Pt was referred by: Stacey Drain, [place], PA-C

## 2018-10-05 ENCOUNTER — Other Ambulatory Visit: Payer: BC Managed Care – PPO

## 2018-10-05 DIAGNOSIS — R6889 Other general symptoms and signs: Secondary | ICD-10-CM | POA: Diagnosis not present

## 2018-10-05 DIAGNOSIS — Z20822 Contact with and (suspected) exposure to covid-19: Secondary | ICD-10-CM

## 2018-10-10 LAB — NOVEL CORONAVIRUS, NAA: SARS-CoV-2, NAA: NOT DETECTED

## 2018-11-07 ENCOUNTER — Telehealth: Payer: Self-pay | Admitting: Adult Health

## 2018-11-07 NOTE — Telephone Encounter (Signed)
Patient called, stated she has a UTI, would like to come and leave a sample, please advise.  [phone]

## 2018-11-08 ENCOUNTER — Telehealth: Payer: Self-pay | Admitting: *Deleted

## 2018-11-08 ENCOUNTER — Other Ambulatory Visit: Payer: Self-pay | Admitting: Advanced Practice Midwife

## 2018-11-08 ENCOUNTER — Other Ambulatory Visit (INDEPENDENT_AMBULATORY_CARE_PROVIDER_SITE_OTHER): Payer: BC Managed Care – PPO | Admitting: *Deleted

## 2018-11-08 ENCOUNTER — Other Ambulatory Visit: Payer: Self-pay

## 2018-11-08 DIAGNOSIS — R300 Dysuria: Secondary | ICD-10-CM

## 2018-11-08 MED ORDER — SULFAMETHOXAZOLE-TRIMETHOPRIM 800-160 MG PO TABS: 1.0000 | ORAL_TABLET | Freq: Two times a day (BID) | ORAL | 0 refills | Status: DC

## 2018-11-08 NOTE — Telephone Encounter (Signed)
Patient came in and left urine for possible uti. She is experiencing frequency and dysuria. Unable to do urine dip due to pt using AZO. (urine strip unreadable) Urine has strong odor.  Urine sent for culture and urinalysis. Advised patient I would see if she could get started on something until culture comes back. She uses [facility] and is allergic to Erythromycin.

## 2018-11-08 NOTE — Progress Notes (Signed)
Pt in for urine sample. She is having frequency and pain with urination. Advised we will dip and discuss with provider and let her know. Patient agreeable.  Patient used AZO so unable to read strip. Urine does have foul odor.

## 2018-11-09 LAB — URINALYSIS, ROUTINE W REFLEX MICROSCOPIC
Bilirubin, UA: NEGATIVE
Glucose, UA: NEGATIVE
Ketones, UA: NEGATIVE
Leukocytes,UA: NEGATIVE
Nitrite, UA: NEGATIVE
Protein,UA: NEGATIVE
RBC, UA: NEGATIVE
Specific Gravity, UA: 1.022 (ref 1.005–1.030)
Urobilinogen, Ur: 0.2 mg/dL (ref 0.2–1.0)
pH, UA: 6 (ref 5.0–7.5)

## 2018-11-09 LAB — SPECIMEN STATUS REPORT

## 2018-11-10 LAB — URINE CULTURE

## 2018-11-10 LAB — SPECIMEN STATUS REPORT

## 2018-11-13 ENCOUNTER — Other Ambulatory Visit: Payer: Self-pay | Admitting: Women's Health

## 2018-11-13 MED ORDER — AMOXICILLIN-POT CLAVULANATE 875-125 MG PO TABS: 1.0000 | ORAL_TABLET | Freq: Two times a day (BID) | ORAL | 0 refills | Status: DC

## 2018-11-27 ENCOUNTER — Other Ambulatory Visit: Payer: Self-pay

## 2018-11-27 MED ORDER — DICYCLOMINE HCL 10 MG PO CAPS: ORAL_CAPSULE | ORAL | 3 refills | Status: DC

## 2018-11-27 NOTE — Progress Notes (Signed)
Sent in prescription to [facility].

## 2019-01-30 ENCOUNTER — Other Ambulatory Visit: Payer: Self-pay | Admitting: Adult Health

## 2019-05-25 IMAGING — MG DIGITAL SCREENING BILATERAL MAMMOGRAM WITH TOMO AND CAD
8 series · 8 of 24 positions shown · non-contrast
Comparison: Previous exam(s).

CLINICAL DATA: Screening.

EXAM:
DIGITAL SCREENING BILATERAL MAMMOGRAM WITH TOMO AND CAD

[R MLO synth-2D]
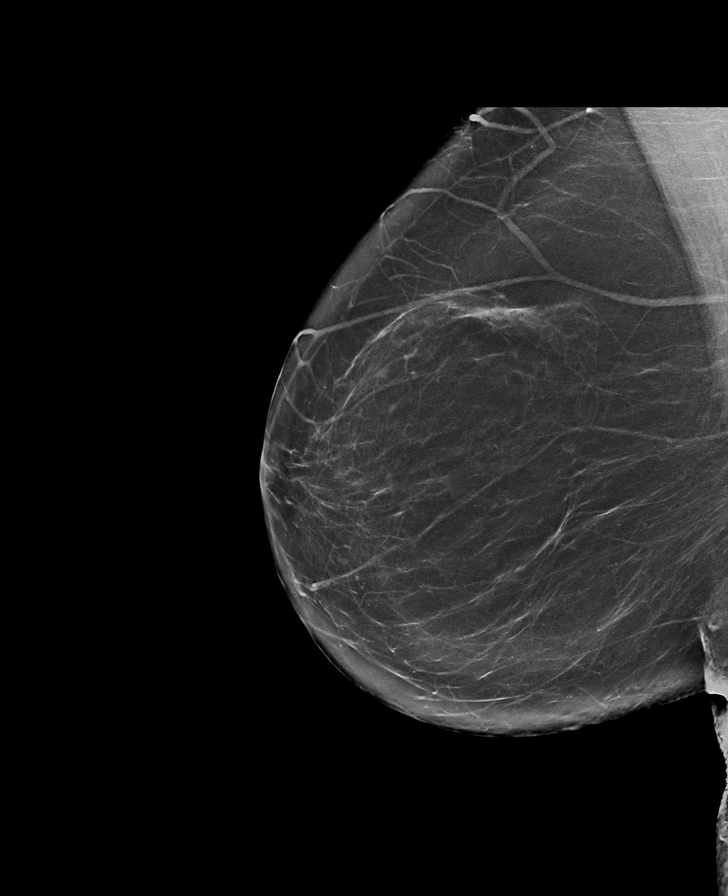

[R CC synth-2D]
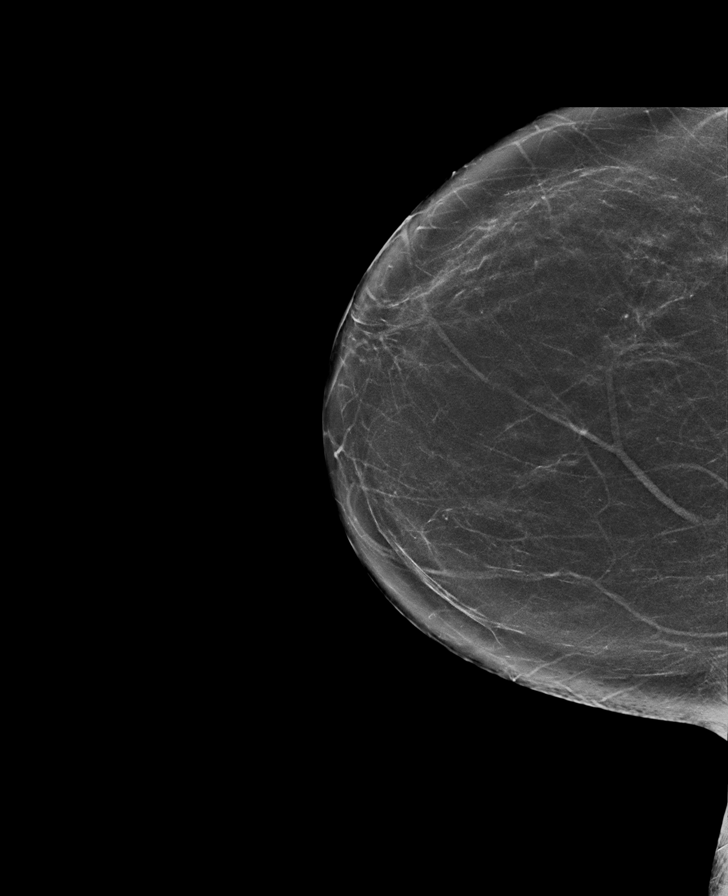

[L MLO synth-2D]
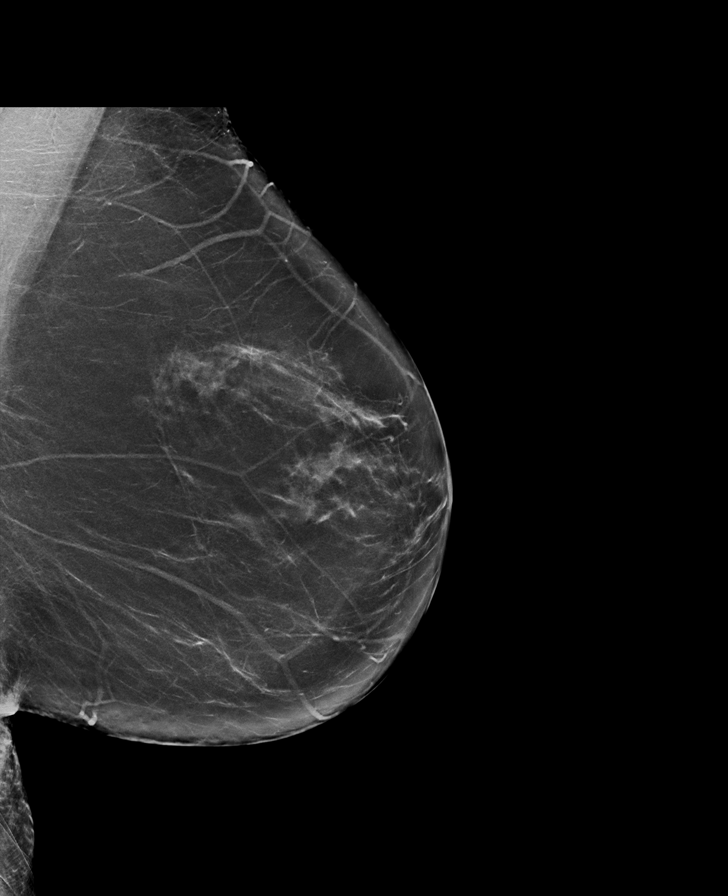

[L CC synth-2D]
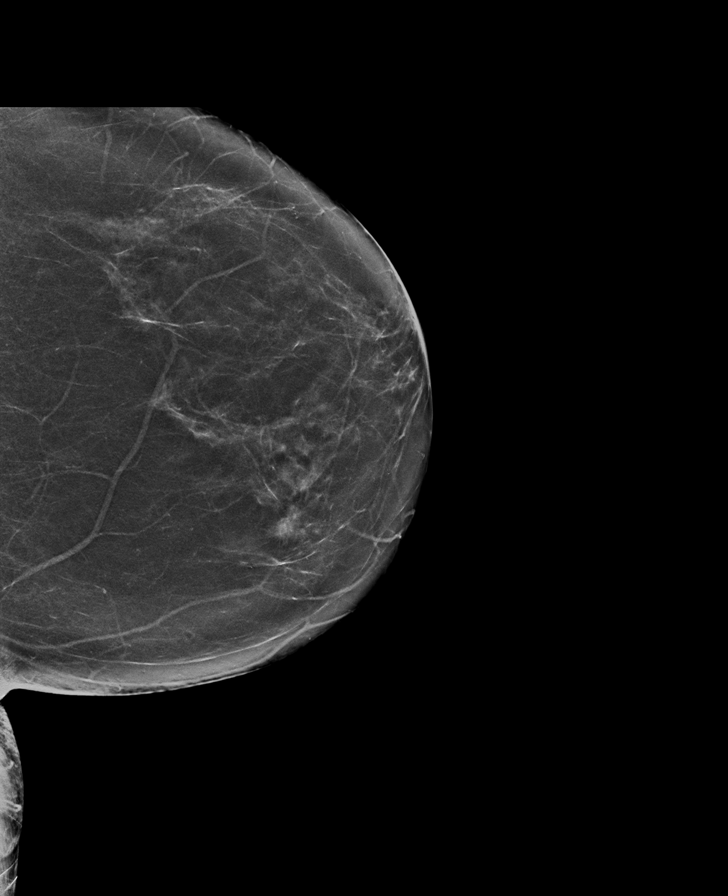

[R MLO tomo · tomo slice 38/75.0]
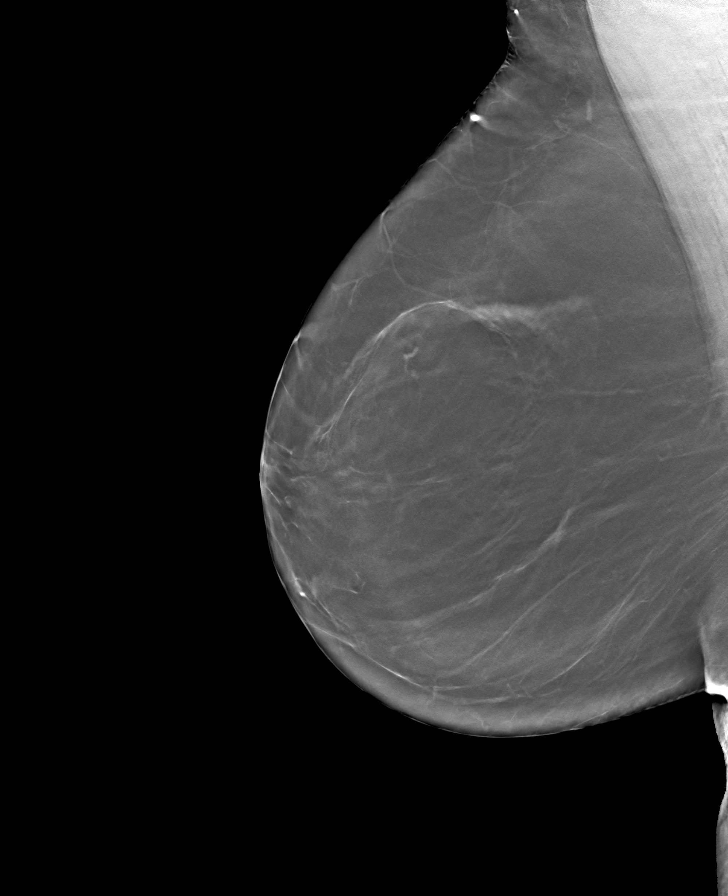

[R CC tomo · tomo slice 36/71.0]
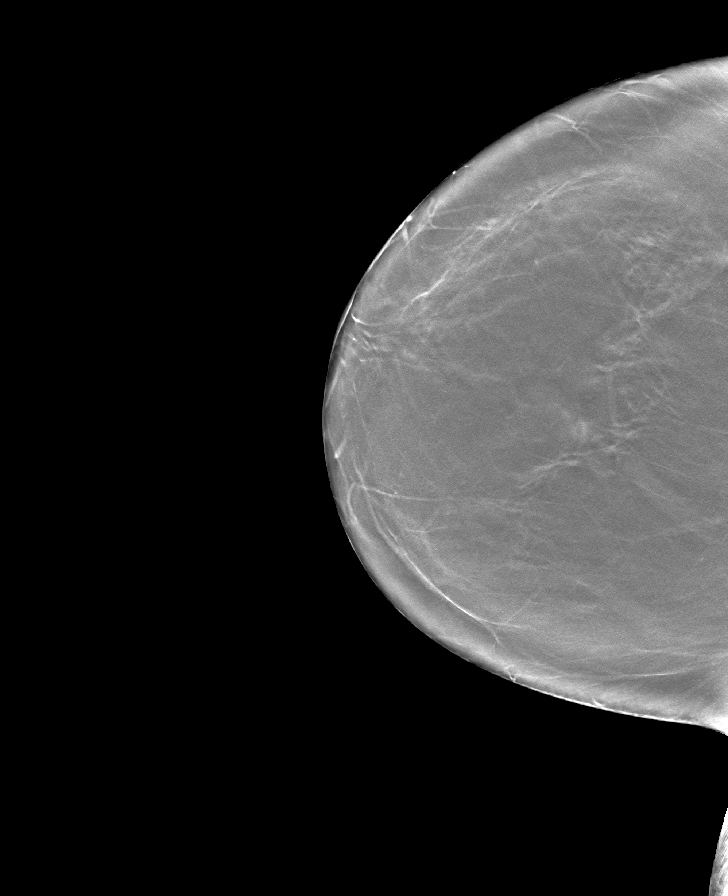

[L MLO tomo · tomo slice 37/74.0]
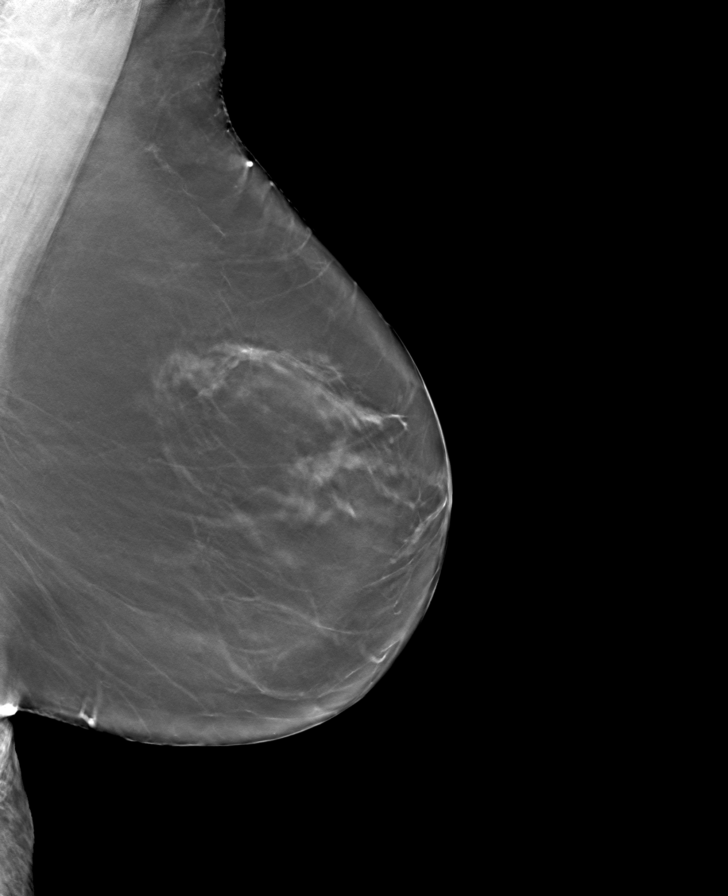

[L CC tomo · tomo slice 35/68.0]
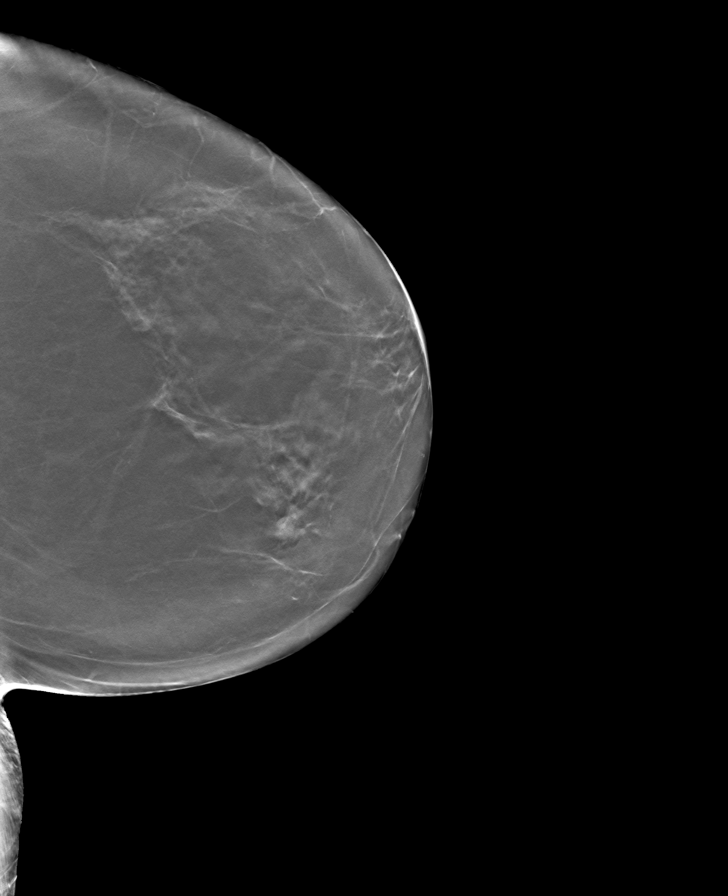

[8 of 24 positions shown; findings below may reference images not displayed]

ACR Breast Density Category b: There are scattered areas of
fibroglandular density.
FINDINGS: There are no findings suspicious for malignancy. Images were
processed with CAD.
IMPRESSION: No mammographic evidence of malignancy. A result letter of this
screening mammogram will be mailed directly to the patient.

RECOMMENDATION:
Screening mammogram in one year. (Code:[id number])

BI-RADS CATEGORY  1: Negative.

## 2019-06-24 ENCOUNTER — Other Ambulatory Visit: Payer: Self-pay | Admitting: Adult Health

## 2019-07-09 ENCOUNTER — Other Ambulatory Visit: Payer: BC Managed Care – PPO | Admitting: Adult Health

## 2019-07-30 ENCOUNTER — Other Ambulatory Visit: Payer: Self-pay

## 2019-07-30 ENCOUNTER — Ambulatory Visit
Admission: EM | Admit: 2019-07-30 | Discharge: 2019-07-30 | Disposition: A | Discharge status: Home / Self Care | Payer: 59 | Attending: Emergency Medicine | Admitting: Emergency Medicine

## 2019-07-30 ENCOUNTER — Encounter: Payer: Self-pay | Admitting: Emergency Medicine

## 2019-07-30 DIAGNOSIS — R300 Dysuria: Secondary | ICD-10-CM | POA: Diagnosis present

## 2019-07-30 LAB — POCT URINALYSIS DIP (MANUAL ENTRY)
Bilirubin, UA: NEGATIVE
Glucose, UA: NEGATIVE mg/dL
Ketones, POC UA: NEGATIVE mg/dL
Nitrite, UA: NEGATIVE
Protein Ur, POC: NEGATIVE mg/dL
Spec Grav, UA: >=1.03 — AB (ref 1.010–1.025)
Urobilinogen, UA: 0.2 E.U./dL
pH, UA: 5 (ref 5.0–8.0)

## 2019-07-30 MED ORDER — PHENAZOPYRIDINE HCL 100 MG PO TABS: 100.0000 mg | ORAL_TABLET | Freq: Three times a day (TID) | ORAL | 0 refills | Status: DC | PRN

## 2019-07-30 MED ORDER — FLUCONAZOLE 150 MG PO TABS: 150.0000 mg | ORAL_TABLET | Freq: Every day | ORAL | 0 refills | Status: DC

## 2019-07-30 NOTE — Discharge Instructions (Addendum)
POCT urine analysis shows small amount of blood and trace of leukocyte/inconclusive for UTI Urine culture sent.  We will call you with the results.   Push fluids and get plenty of rest.   Take Diflucan for yeast infection Take pyridium as prescribed and as needed for symptomatic relief Follow up with PCP if symptoms persists Return here or go to ER if you have any new or worsening symptoms such as fever, worsening abdominal pain, nausea/vomiting, flank pain, etc..Marland Kitchen

## 2019-07-30 NOTE — ED Triage Notes (Signed)
Burning with urination, symptoms for 2 days.  Patient believes this is related to a perfumed soap.  Denies abdominal pain or back pain

## 2019-07-30 NOTE — ED Provider Notes (Signed)
[facility]   CC: Burning with urination  SUBJECTIVE:  Shannon Lindsey is a 54 y.o. female who complains ofdysuria for the past 2  days.  Says she has been using perfumed soap for private area.  Patient denies a precipitating event,  excessive caffeine intake.  Localizes the pain to the lower abdomen/ flank.  Pain is intermittent and described as burning.  Has tried OTC medications without relief.  Symptoms are made worse with urination.  Admits to similar symptoms in the past.  Denies fever, chills, nausea, vomiting, abdominal pain, flank pain, abnormal vaginal discharge or bleeding, hematuria.    LMP: No LMP recorded. Patient is postmenopausal.  ROS: As in HPI.  All other pertinent ROS negative.     Past Medical History:  Diagnosis Date  . Abnormal Pap smear   . Burning with urination 09/29/2015  . Cancer (HCC)    squamous cell chest  . Carpal tunnel syndrome, bilateral 09/29/2015  . Diverticulitis   . H/O hiatal hernia   . HSV-1 (herpes simplex virus 1) infection   . HSV-2 (herpes simplex virus 2) infection   . Hyperlipidemia   . Hypertension   . Vaginal dryness 07/17/2014  . Vaginal Pap smear, abnormal   . Vitamin D deficiency 09/09/2015  . Warts, genital    Past Surgical History:  Procedure Laterality Date  . CESAREAN SECTION    . COLONOSCOPY N/A 07/19/2016   Dr. Oneida Alar: 6 mm polyp, benign, diverticulosis involving rectosigmoid colon and sigmoid colon, small internal hemorrhoids.  Due to family history of colon cancer in a first-degree relative at early age, plans for next colonoscopy 5 years.  . COLONOSCOPY WITH ESOPHAGOGASTRODUODENOSCOPY (EGD)  2011   Dr. Oneida Alar: small hh, gastritis bx negative for h.pylori, diverticulosis of colon, two hyperplastic colon polyps removed. TCS in 5 years due to father having colon cancer at age 36  . COLPOSCOPY W/ BIOPSY / CURETTAGE    . EXPLORATORY LAPAROTOMY    . MELANOMA EXCISION Right 12/25/2012   Procedure: wide EXCISION  squamous cell carcinoma right chest ;  Surgeon: Zenovia Jarred, MD;  Location: [facility];  Service: General;  Laterality: Right;  . MOLE REMOVAL     has had 30 + removed not cancer  . TONSILLECTOMY    . TUBAL LIGATION     Allergies  Allergen Reactions  . Erythromycin Other (See Comments)    Gets a UTI every time.  . Lisinopril Other (See Comments)    cough  . Phenergan [Promethazine Hcl]     25 m iv made her feel jittery and nauseated. TOLERATED 6.25 MG IV.   No current facility-administered medications on file prior to encounter.   Current Outpatient Medications on File Prior to Encounter  Medication Sig Dispense Refill  . buPROPion (WELLBUTRIN XL) 150 MG 24 hr tablet Take 1 tablet by mouth daily.  11  . Cholecalciferol 5000 units capsule Take 1 capsule (5,000 Units total) by mouth daily.    Marland Kitchen losartan-hydrochlorothiazide (HYZAAR) 50-12.5 MG tablet TAKE 1 TABLET BY MOUTH EVERY DAY 90 tablet 4  . valACYclovir (VALTREX) 1000 MG tablet TAKE 1 TABLET BY MOUTH EVERY DAY 90 tablet PRN  . amoxicillin-clavulanate (AUGMENTIN) 875-125 MG tablet Take 1 tablet by mouth 2 (two) times daily. 14 tablet 0  . dicyclomine (BENTYL) 10 MG capsule Take one cap before meals and at bedtime as needed for abdominal pain/cramping 120 capsule 3  . sulfamethoxazole-trimethoprim (BACTRIM DS) 800-160 MG tablet Take 1 tablet by mouth  2 (two) times daily. 10 tablet 0   Social History   Socioeconomic History  . Marital status: Legally Separated    Spouse name: Not on file  . Number of children: Not on file  . Years of education: Not on file  . Highest education level: Not on file  Occupational History  . Not on file  Tobacco Use  . Smoking status: Never Smoker  . Smokeless tobacco: Never Used  Substance and Sexual Activity  . Alcohol use: Yes    Comment: occ.  . Drug use: No  . Sexual activity: Not Currently    Birth control/protection: Post-menopausal, Surgical    Comment: tubal  Other Topics Concern    . Not on file  Social History Narrative  . Not on file   Social Determinants of Health   Financial Resource Strain:   . Difficulty of Paying Living Expenses:   Food Insecurity:   . Worried About Charity fundraiser in the Last Year:   . Arboriculturist in the Last Year:   Transportation Needs:   . Film/video editor (Medical):   Marland Kitchen Lack of Transportation (Non-Medical):   Physical Activity:   . Days of Exercise per Week:   . Minutes of Exercise per Session:   Stress:   . Feeling of Stress :   Social Connections:   . Frequency of Communication with Friends and Family:   . Frequency of Social Gatherings with Friends and Family:   . Attends Religious Services:   . Active Member of Clubs or Organizations:   . Attends Archivist Meetings:   Marland Kitchen Marital Status:   Intimate Partner Violence:   . Fear of Current or Ex-Partner:   . Emotionally Abused:   Marland Kitchen Physically Abused:   . Sexually Abused:    Family History  Problem Relation Age of Onset  . Cancer Father        colon and lung  . COPD Father   . Colon cancer Father 48  . Atrial fibrillation Mother   . Diabetes Mother   . Hyperlipidemia Mother   . Hypertension Mother   . Stroke Sister   . Heart attack Brother   . Other Daughter        benign pituitary tumor  . Ovarian cancer Maternal Aunt     OBJECTIVE:  Vitals:   07/30/19 1632  BP: 107/72  Pulse: 80  Resp: 15  Temp: 98.3 F (36.8 C)  TempSrc: Oral  SpO2: 96%   General appearance: AOx3 in no acute distress HEENT: NCAT.  Oropharynx clear.  Lungs: clear to auscultation bilaterally without adventitious breath sounds Heart: regular rate and rhythm.  Radial pulses 2+ symmetrical bilaterally Abdomen: soft; non-distended; no tenderness; bowel sounds present; no guarding or rebound tenderness Back: no CVA tenderness Extremities: no edema; symmetrical with no gross deformities Skin: warm and dry Neurologic: Ambulates from chair to exam table without  difficulty Psychological: alert and cooperative; normal mood and affect  Labs Reviewed  POCT URINALYSIS DIP (MANUAL ENTRY) - Abnormal; Notable for the following components:      Result Value   Spec Grav, UA >=1.030 (*)    Blood, UA small (*)    Leukocytes, UA Trace (*)    All other components within normal limits  URINE CULTURE    ASSESSMENT & PLAN:  1. Dysuria     POCT urine analysis show small amount of blood and trace of leukocyte.  Inconclusive for UTI.  Meds ordered  this encounter  Medications  . phenazopyridine (PYRIDIUM) 100 MG tablet    Sig: Take 1 tablet (100 mg total) by mouth 3 (three) times daily as needed for pain.    Dispense:  15 tablet    Refill:  0  . fluconazole (DIFLUCAN) 150 MG tablet    Sig: Take 1 tablet (150 mg total) by mouth daily. May take a second dose if symptom does not resolve soon 72 hours after the first dose    Dispense:  2 tablet    Refill:  0   Discharge instruction POCT urine analysis shows small amount of blood and trace of leukocyte/inconclusive for UTI Urine culture sent.  We will call you with the results.   Push fluids and get plenty of rest.   Take Diflucan for yeast infection Take pyridium as prescribed and as needed for symptomatic relief Follow up with PCP if symptoms persists Return here or go to ER if you have any new or worsening symptoms such as fever, worsening abdominal pain, nausea/vomiting, flank pain, etc...  Outlined signs and symptoms indicating need for more acute intervention. Patient verbalized understanding. After Visit Summary given.     Emerson Monte, FNP 07/30/19 1714

## 2019-07-31 LAB — URINE CULTURE: Culture: NO GROWTH

## 2019-08-06 ENCOUNTER — Telehealth: Payer: Self-pay | Admitting: Emergency Medicine

## 2019-08-06 NOTE — Telephone Encounter (Signed)
11:50 AM returned patient call.  Called number in medical record and got a voicemail.  Left message that I was returning call.    [phone] PM.  Patient came to clinic, verified identity with 2 identifiers.  Informed patient that urine was negative.  Patient reports she is doing better than she was and has stopped using performed soaps

## 2019-08-14 ENCOUNTER — Other Ambulatory Visit: Payer: Self-pay

## 2019-08-14 ENCOUNTER — Ambulatory Visit
Admission: EM | Admit: 2019-08-14 | Discharge: 2019-08-14 | Disposition: A | Discharge status: Home / Self Care | Payer: 59 | Attending: Emergency Medicine | Admitting: Emergency Medicine

## 2019-08-14 DIAGNOSIS — N390 Urinary tract infection, site not specified: Secondary | ICD-10-CM | POA: Diagnosis not present

## 2019-08-14 LAB — POCT URINALYSIS DIP (MANUAL ENTRY)
Blood, UA: NEGATIVE
Glucose, UA: 250 mg/dL — AB
Nitrite, UA: POSITIVE — AB
Protein Ur, POC: 100 mg/dL — AB
Spec Grav, UA: 1.015 (ref 1.010–1.025)
Urobilinogen, UA: >=8 E.U./dL — AB
pH, UA: 5 (ref 5.0–8.0)

## 2019-08-14 MED ORDER — NITROFURANTOIN MONOHYD MACRO 100 MG PO CAPS: 100.0000 mg | ORAL_CAPSULE | Freq: Two times a day (BID) | ORAL | 0 refills | Status: DC

## 2019-08-14 NOTE — Discharge Instructions (Addendum)
Discharge instructions Urine culture sent.  We will call you with the results.   Push fluids and get plenty of rest.   Take antibiotic as directed and to completion Take pyridium as prescribed and as needed for symptomatic relief Follow up with PCP if symptoms persists Return here or go to ER if you have any new or worsening symptoms such as fever, worsening abdominal pain, nausea/vomiting, flank pain, etc

## 2019-08-14 NOTE — ED Provider Notes (Signed)
[facility]   CC: Burning with urination  SUBJECTIVE:  Shannon Lindsey is a 54 y.o. female presented to the urgent care for complaint of continued dysuria for the past 2 weeks.  Patient denies a precipitating event, recent sexual encounter, excessive caffeine intake.    Has tried OTC Azo without relief.  Symptoms are made worse with urination.  Admits to similar symptoms in the past.  Denies fever, chills, nausea, vomiting, abdominal pain, flank pain, abnormal vaginal discharge or bleeding, hematuria.    LMP: No LMP recorded. Patient is postmenopausal.  ROS: As in HPI.  All other pertinent ROS negative.     Past Medical History:  Diagnosis Date  . Abnormal Pap smear   . Burning with urination 09/29/2015  . Cancer (HCC)    squamous cell chest  . Carpal tunnel syndrome, bilateral 09/29/2015  . Diverticulitis   . H/O hiatal hernia   . HSV-1 (herpes simplex virus 1) infection   . HSV-2 (herpes simplex virus 2) infection   . Hyperlipidemia   . Hypertension   . Vaginal dryness 07/17/2014  . Vaginal Pap smear, abnormal   . Vitamin D deficiency 09/09/2015  . Warts, genital    Past Surgical History:  Procedure Laterality Date  . CESAREAN SECTION    . COLONOSCOPY N/A 07/19/2016   Dr. Oneida Alar: 6 mm polyp, benign, diverticulosis involving rectosigmoid colon and sigmoid colon, small internal hemorrhoids.  Due to family history of colon cancer in a first-degree relative at early age, plans for next colonoscopy 5 years.  . COLONOSCOPY WITH ESOPHAGOGASTRODUODENOSCOPY (EGD)  2011   Dr. Oneida Alar: small hh, gastritis bx negative for h.pylori, diverticulosis of colon, two hyperplastic colon polyps removed. TCS in 5 years due to father having colon cancer at age 27  . COLPOSCOPY W/ BIOPSY / CURETTAGE    . EXPLORATORY LAPAROTOMY    . MELANOMA EXCISION Right 12/25/2012   Procedure: wide EXCISION squamous cell carcinoma right chest ;  Surgeon: Zenovia Jarred, MD;  Location: [facility];  Service:  General;  Laterality: Right;  . MOLE REMOVAL     has had 30 + removed not cancer  . TONSILLECTOMY    . TUBAL LIGATION     Allergies  Allergen Reactions  . Erythromycin Other (See Comments)    Gets a UTI every time.  . Lisinopril Other (See Comments)    cough  . Phenergan [Promethazine Hcl]     25 m iv made her feel jittery and nauseated. TOLERATED 6.25 MG IV.   No current facility-administered medications on file prior to encounter.   Current Outpatient Medications on File Prior to Encounter  Medication Sig Dispense Refill  . amoxicillin-clavulanate (AUGMENTIN) 875-125 MG tablet Take 1 tablet by mouth 2 (two) times daily. 14 tablet 0  . buPROPion (WELLBUTRIN XL) 150 MG 24 hr tablet Take 1 tablet by mouth daily.  11  . Cholecalciferol 5000 units capsule Take 1 capsule (5,000 Units total) by mouth daily.    Marland Kitchen dicyclomine (BENTYL) 10 MG capsule Take one cap before meals and at bedtime as needed for abdominal pain/cramping 120 capsule 3  . fluconazole (DIFLUCAN) 150 MG tablet Take 1 tablet (150 mg total) by mouth daily. May take a second dose if symptom does not resolve soon 72 hours after the first dose 2 tablet 0  . losartan-hydrochlorothiazide (HYZAAR) 50-12.5 MG tablet TAKE 1 TABLET BY MOUTH EVERY DAY 90 tablet 4  . phenazopyridine (PYRIDIUM) 100 MG tablet Take 1 tablet (100 mg  total) by mouth 3 (three) times daily as needed for pain. 15 tablet 0  . sulfamethoxazole-trimethoprim (BACTRIM DS) 800-160 MG tablet Take 1 tablet by mouth 2 (two) times daily. 10 tablet 0  . valACYclovir (VALTREX) 1000 MG tablet TAKE 1 TABLET BY MOUTH EVERY DAY 90 tablet PRN   Social History   Socioeconomic History  . Marital status: Legally Separated    Spouse name: Not on file  . Number of children: Not on file  . Years of education: Not on file  . Highest education level: Not on file  Occupational History  . Not on file  Tobacco Use  . Smoking status: Never Smoker  . Smokeless tobacco: Never Used   Substance and Sexual Activity  . Alcohol use: Yes    Comment: occ.  . Drug use: No  . Sexual activity: Not Currently    Birth control/protection: Post-menopausal, Surgical    Comment: tubal  Other Topics Concern  . Not on file  Social History Narrative  . Not on file   Social Determinants of Health   Financial Resource Strain:   . Difficulty of Paying Living Expenses:   Food Insecurity:   . Worried About Charity fundraiser in the Last Year:   . Arboriculturist in the Last Year:   Transportation Needs:   . Film/video editor (Medical):   Marland Kitchen Lack of Transportation (Non-Medical):   Physical Activity:   . Days of Exercise per Week:   . Minutes of Exercise per Session:   Stress:   . Feeling of Stress :   Social Connections:   . Frequency of Communication with Friends and Family:   . Frequency of Social Gatherings with Friends and Family:   . Attends Religious Services:   . Active Member of Clubs or Organizations:   . Attends Archivist Meetings:   Marland Kitchen Marital Status:   Intimate Partner Violence:   . Fear of Current or Ex-Partner:   . Emotionally Abused:   Marland Kitchen Physically Abused:   . Sexually Abused:    Family History  Problem Relation Age of Onset  . Cancer Father        colon and lung  . COPD Father   . Colon cancer Father 6  . Atrial fibrillation Mother   . Diabetes Mother   . Hyperlipidemia Mother   . Hypertension Mother   . Stroke Sister   . Heart attack Brother   . Other Daughter        benign pituitary tumor  . Ovarian cancer Maternal Aunt     OBJECTIVE:  Vitals:   08/14/19 1911  BP: 119/73  Pulse: 89  Resp: 20  Temp: 98.5 F (36.9 C)  SpO2: 96%   General appearance: AOx3 in no acute distress HEENT: NCAT.  Oropharynx clear.  Lungs: clear to auscultation bilaterally without adventitious breath sounds Heart: regular rate and rhythm.  Radial pulses 2+ symmetrical bilaterally Abdomen: soft; non-distended; no tenderness; bowel sounds  present; no guarding or rebound tenderness Back: no CVA tenderness Extremities: no edema; symmetrical with no gross deformities Skin: warm and dry Neurologic: Ambulates from chair to exam table without difficulty Psychological: alert and cooperative; normal mood and affect  Labs Reviewed  POCT URINALYSIS DIP (MANUAL ENTRY) - Abnormal; Notable for the following components:      Result Value   Color, UA orange (*)    Glucose, UA =250 (*)    Bilirubin, UA moderate (*)    Ketones, POC  UA small (15) (*)    Protein Ur, POC =100 (*)    Urobilinogen, UA >=8.0 (*)    Nitrite, UA Positive (*)    Leukocytes, UA Large (3+) (*)    All other components within normal limits  URINE CULTURE    ASSESSMENT & PLAN:  1. Lower urinary tract infection     Meds ordered this encounter  Medications  . nitrofurantoin, macrocrystal-monohydrate, (MACROBID) 100 MG capsule    Sig: Take 1 capsule (100 mg total) by mouth 2 (two) times daily.    Dispense:  10 capsule    Refill:  0   Discharge instructions Urine culture sent.  We will call you with the results.   Push fluids and get plenty of rest.   Take antibiotic as directed and to completion Take pyridium as prescribed and as needed for symptomatic relief Follow up with PCP if symptoms persists Return here or go to ER if you have any new or worsening symptoms such as fever, worsening abdominal pain, nausea/vomiting, flank pain, etc...  Outlined signs and symptoms indicating need for more acute intervention. Patient verbalized understanding. After Visit Summary given.     Emerson Monte, FNP 08/14/19 1923

## 2019-08-14 NOTE — ED Triage Notes (Signed)
Pt presents with continues dysuria after visit last week

## 2019-08-16 LAB — URINE CULTURE: Culture: 50000 — AB

## 2019-08-23 ENCOUNTER — Other Ambulatory Visit: Payer: Self-pay

## 2019-08-23 ENCOUNTER — Encounter: Payer: Self-pay | Admitting: Emergency Medicine

## 2019-08-23 ENCOUNTER — Ambulatory Visit
Admission: EM | Admit: 2019-08-23 | Discharge: 2019-08-23 | Disposition: A | Discharge status: Home / Self Care | Payer: 59 | Attending: Emergency Medicine | Admitting: Emergency Medicine

## 2019-08-23 ENCOUNTER — Telehealth: Payer: Self-pay | Admitting: Adult Health

## 2019-08-23 ENCOUNTER — Telehealth: Payer: Self-pay | Admitting: *Deleted

## 2019-08-23 DIAGNOSIS — Z113 Encounter for screening for infections with a predominantly sexual mode of transmission: Secondary | ICD-10-CM | POA: Insufficient documentation

## 2019-08-23 DIAGNOSIS — Z202 Contact with and (suspected) exposure to infections with a predominantly sexual mode of transmission: Secondary | ICD-10-CM | POA: Diagnosis present

## 2019-08-23 DIAGNOSIS — R300 Dysuria: Secondary | ICD-10-CM | POA: Insufficient documentation

## 2019-08-23 LAB — POCT URINALYSIS DIP (MANUAL ENTRY)
Glucose, UA: 100 mg/dL — AB
Ketones, POC UA: NEGATIVE mg/dL
Nitrite, UA: POSITIVE — AB
Spec Grav, UA: >=1.03 — AB (ref 1.010–1.025)
Urobilinogen, UA: 1 E.U./dL
pH, UA: 5 (ref 5.0–8.0)

## 2019-08-23 MED ORDER — SULFAMETHOXAZOLE-TRIMETHOPRIM 800-160 MG PO TABS: 1.0000 | ORAL_TABLET | Freq: Two times a day (BID) | ORAL | 0 refills | Status: AC

## 2019-08-23 MED ORDER — CEFTRIAXONE SODIUM 500 MG IJ SOLR
500.0000 mg | Freq: Once | INTRAMUSCULAR | Status: AC
  Administered 2019-08-23: 500 mg via INTRAMUSCULAR

## 2019-08-23 NOTE — Telephone Encounter (Signed)
Telephoned patient at home number. Patient states was seen at urgent care 5/18 and still experiencing symptoms. Patient now has green discharge with odor. Patient was given macrobid but did not clear up infection. Advised patient would need to be evaluated in the office since having discharge. Offered patient to schedule appointment possibly 5/28 but refused. Advised patient could go back to urgent care. Patient voiced understanding.

## 2019-08-23 NOTE — Discharge Instructions (Addendum)
Vaginal self-swab obtained.  We will follow up with you regarding abnormal results Given rocephin 500 mg injection Take medications as prescribed and to completion If tests results are positive, please abstain from sexual activity until you and your partner(s) have been treated Follow up with PCP  Return here or go to ER if you have any new or worsening symptoms fever, chills, nausea, vomiting, abdominal or pelvic pain, painful intercourse, vaginal discharge, vaginal bleeding, persistent symptoms despite treatment, etc..Marland Kitchen

## 2019-08-23 NOTE — ED Triage Notes (Signed)
Pt seen on 05/18 for UTI, states she felt better for a couple of days after taking abx but is now having dysuria, odor and a green color in urine.

## 2019-08-23 NOTE — ED Provider Notes (Signed)
[facility]   [id number] 08/23/19 Arrival Time: 1547   [id number] DISCHARGE/ dysuria  SUBJECTIVE:  Shannon Lindsey is a 54 y.o. female who presents with complaints of gradual vaginal discharge that began for the past few days. Was seen previously and was treated for UTI. Stated she had a recent sexual encounter with her partner.  Patient is sexually active with 1 female partners.  Describes discharge as thin and green.  She has tried OTC medication and antibiotic without relief.  She reports worsening symptoms over few days.  Denied similar symptom in the past.  She denies fever, chills, nausea, vomiting, abdominal or pelvic pain, urinary symptoms, vaginal itching, vaginal bleeding,  vaginal rashes or lesions.   No LMP recorded. Patient is postmenopausal. Current birth control method: Compliant with BC:  ROS: As per HPI.  All other pertinent ROS negative.     Past Medical History:  Diagnosis Date  . Abnormal Pap smear   . Burning with urination 09/29/2015  . Cancer (HCC)    squamous cell chest  . Carpal tunnel syndrome, bilateral 09/29/2015  . Diverticulitis   . H/O hiatal hernia   . HSV-1 (herpes simplex virus 1) infection   . HSV-2 (herpes simplex virus 2) infection   . Hyperlipidemia   . Hypertension   . Vaginal dryness 07/17/2014  . Vaginal Pap smear, abnormal   . Vitamin D deficiency 09/09/2015  . Warts, genital    Past Surgical History:  Procedure Laterality Date  . CESAREAN SECTION    . COLONOSCOPY N/A 07/19/2016   Dr. Oneida Alar: 6 mm polyp, benign, diverticulosis involving rectosigmoid colon and sigmoid colon, small internal hemorrhoids.  Due to family history of colon cancer in a first-degree relative at early age, plans for next colonoscopy 5 years.  . COLONOSCOPY WITH ESOPHAGOGASTRODUODENOSCOPY (EGD)  2011   Dr. Oneida Alar: small hh, gastritis bx negative for h.pylori, diverticulosis of colon, two hyperplastic colon polyps removed. TCS in 5 years due to father having  colon cancer at age 35  . COLPOSCOPY W/ BIOPSY / CURETTAGE    . EXPLORATORY LAPAROTOMY    . MELANOMA EXCISION Right 12/25/2012   Procedure: wide EXCISION squamous cell carcinoma right chest ;  Surgeon: Zenovia Jarred, MD;  Location: [facility];  Service: General;  Laterality: Right;  . MOLE REMOVAL     has had 30 + removed not cancer  . TONSILLECTOMY    . TUBAL LIGATION     Allergies  Allergen Reactions  . Erythromycin Other (See Comments)    Gets a UTI every time.  . Lisinopril Other (See Comments)    cough  . Phenergan [Promethazine Hcl]     25 m iv made her feel jittery and nauseated. TOLERATED 6.25 MG IV.   No current facility-administered medications on file prior to encounter.   Current Outpatient Medications on File Prior to Encounter  Medication Sig Dispense Refill  . amoxicillin-clavulanate (AUGMENTIN) 875-125 MG tablet Take 1 tablet by mouth 2 (two) times daily. 14 tablet 0  . buPROPion (WELLBUTRIN XL) 150 MG 24 hr tablet Take 1 tablet by mouth daily.  11  . Cholecalciferol 5000 units capsule Take 1 capsule (5,000 Units total) by mouth daily.    Marland Kitchen dicyclomine (BENTYL) 10 MG capsule Take one cap before meals and at bedtime as needed for abdominal pain/cramping 120 capsule 3  . fluconazole (DIFLUCAN) 150 MG tablet Take 1 tablet (150 mg total) by mouth daily. May take a second dose if symptom does not  resolve soon 72 hours after the first dose 2 tablet 0  . losartan-hydrochlorothiazide (HYZAAR) 50-12.5 MG tablet TAKE 1 TABLET BY MOUTH EVERY DAY 90 tablet 4  . nitrofurantoin, macrocrystal-monohydrate, (MACROBID) 100 MG capsule Take 1 capsule (100 mg total) by mouth 2 (two) times daily. 10 capsule 0  . phenazopyridine (PYRIDIUM) 100 MG tablet Take 1 tablet (100 mg total) by mouth 3 (three) times daily as needed for pain. 15 tablet 0  . valACYclovir (VALTREX) 1000 MG tablet TAKE 1 TABLET BY MOUTH EVERY DAY 90 tablet PRN    Social History   Socioeconomic History  . Marital status:  Legally Separated    Spouse name: Not on file  . Number of children: Not on file  . Years of education: Not on file  . Highest education level: Not on file  Occupational History  . Not on file  Tobacco Use  . Smoking status: Never Smoker  . Smokeless tobacco: Never Used  Substance and Sexual Activity  . Alcohol use: Yes    Comment: occ.  . Drug use: No  . Sexual activity: Not Currently    Birth control/protection: Post-menopausal, Surgical    Comment: tubal  Other Topics Concern  . Not on file  Social History Narrative  . Not on file   Social Determinants of Health   Financial Resource Strain:   . Difficulty of Paying Living Expenses:   Food Insecurity:   . Worried About Charity fundraiser in the Last Year:   . Arboriculturist in the Last Year:   Transportation Needs:   . Film/video editor (Medical):   Marland Kitchen Lack of Transportation (Non-Medical):   Physical Activity:   . Days of Exercise per Week:   . Minutes of Exercise per Session:   Stress:   . Feeling of Stress :   Social Connections:   . Frequency of Communication with Friends and Family:   . Frequency of Social Gatherings with Friends and Family:   . Attends Religious Services:   . Active Member of Clubs or Organizations:   . Attends Archivist Meetings:   Marland Kitchen Marital Status:   Intimate Partner Violence:   . Fear of Current or Ex-Partner:   . Emotionally Abused:   Marland Kitchen Physically Abused:   . Sexually Abused:    Family History  Problem Relation Age of Onset  . Cancer Father        colon and lung  . COPD Father   . Colon cancer Father 17  . Atrial fibrillation Mother   . Diabetes Mother   . Hyperlipidemia Mother   . Hypertension Mother   . Stroke Sister   . Heart attack Brother   . Other Daughter        benign pituitary tumor  . Ovarian cancer Maternal Aunt     OBJECTIVE:  Vitals:   08/23/19 1558  BP: (!) 149/87  Pulse: 80  Resp: 18  Temp: 97.8 F (36.6 C)  TempSrc: Oral  SpO2:  96%  Weight: 136 lb (61.7 kg)  Height: 5\' 3"  (1.6 m)     General appearance: Alert, NAD, appears stated age Head: NCAT Throat: lips, mucosa, and tongue normal; teeth and gums normal Lungs: CTA bilaterally without adventitious breath sounds Heart: regular rate and rhythm.  Radial pulses 2+ symmetrical bilaterally Back: no CVA tenderness Abdomen: soft, non-tender; bowel sounds normal; no masses or organomegaly; no guarding or rebound tenderness GU: Self Cervical swab obtained Skin: warm and dry  Psychological:  Alert and cooperative. Normal mood and affect.  LABS:  Results for orders placed or performed during the hospital encounter of 08/23/19  POCT urinalysis dipstick  Result Value Ref Range   Color, UA orange (A) yellow   Clarity, UA clear clear   Glucose, UA =100 (A) negative mg/dL   Bilirubin, UA small (A) negative   Ketones, POC UA negative negative mg/dL   Spec Grav, UA >=1.030 (A) 1.010 - 1.025   Blood, UA trace-lysed (A) negative   pH, UA 5.0 5.0 - 8.0   Protein Ur, POC trace (A) negative mg/dL   Urobilinogen, UA 1.0 0.2 or 1.0 E.U./dL   Nitrite, UA Positive (A) Negative   Leukocytes, UA Small (1+) (A) Negative    Labs Reviewed  POCT URINALYSIS DIP (MANUAL ENTRY) - Abnormal; Notable for the following components:      Result Value   Color, UA orange (*)    Glucose, UA =100 (*)    Bilirubin, UA small (*)    Spec Grav, UA >=1.030 (*)    Blood, UA trace-lysed (*)    Protein Ur, POC trace (*)    Nitrite, UA Positive (*)    Leukocytes, UA Small (1+) (*)    All other components within normal limits  URINE CULTURE  RPR  HIV ANTIBODY (ROUTINE TESTING W REFLEX)  CERVICOVAGINAL ANCILLARY ONLY    ASSESSMENT & PLAN:  1. Dysuria   2. Screening for STD (sexually transmitted disease)   3. Gonorrhea contact, treated     Meds ordered this encounter  Medications  . cefTRIAXone (ROCEPHIN) injection 500 mg  . sulfamethoxazole-trimethoprim (BACTRIM DS) 800-160 MG tablet      Sig: Take 1 tablet by mouth 2 (two) times daily for 7 days.    Dispense:  14 tablet    Refill:  0    Pending: Labs Reviewed  POCT URINALYSIS DIP (MANUAL ENTRY) - Abnormal; Notable for the following components:      Result Value   Color, UA orange (*)    Glucose, UA =100 (*)    Bilirubin, UA small (*)    Spec Grav, UA >=1.030 (*)    Blood, UA trace-lysed (*)    Protein Ur, POC trace (*)    Nitrite, UA Positive (*)    Leukocytes, UA Small (1+) (*)    All other components within normal limits  URINE CULTURE  RPR  HIV ANTIBODY (ROUTINE TESTING W REFLEX)  CERVICOVAGINAL ANCILLARY ONLY    Discharge Instructions Vaginal self-swab obtained.  We will follow up with you regarding abnormal results Given rocephin 500 mg injection Take medications as prescribed and to completion If tests results are positive, please abstain from sexual activity until you and your partner(s) have been treated Follow up with PCP Return here or go to ER if you have any new or worsening symptoms fever, chills, nausea, vomiting, abdominal or pelvic pain, painful intercourse, vaginal discharge, vaginal bleeding, persistent symptoms despite treatment, etc...  Reviewed expectations re: course of current medical issues. Questions answered. Outlined signs and symptoms indicating need for more acute intervention. Patient verbalized understanding. After Visit Summary given.       Emerson Monte, [place] 08/23/19 [phone]

## 2019-08-23 NOTE — Telephone Encounter (Signed)
Pt wants to know if she can come by and leave urine sample without being see?

## 2019-08-24 ENCOUNTER — Telehealth (HOSPITAL_COMMUNITY): Payer: Self-pay | Admitting: Orthopedic Surgery

## 2019-08-24 LAB — CERVICOVAGINAL ANCILLARY ONLY
Bacterial Vaginitis (gardnerella): NEGATIVE
Candida Glabrata: NEGATIVE
Candida Vaginitis: NEGATIVE
Chlamydia: NEGATIVE
Comment: NEGATIVE
Comment: NEGATIVE
Comment: NEGATIVE
Comment: NEGATIVE
Comment: NEGATIVE
Comment: NORMAL
Neisseria Gonorrhea: NEGATIVE
Trichomonas: POSITIVE — AB

## 2019-08-24 LAB — HIV ANTIBODY (ROUTINE TESTING W REFLEX): HIV Screen 4th Generation wRfx: NONREACTIVE

## 2019-08-24 LAB — RPR: RPR Ser Ql: NONREACTIVE

## 2019-08-24 MED ORDER — METRONIDAZOLE 500 MG PO TABS
500.0000 mg | ORAL_TABLET | Freq: Two times a day (BID) | ORAL | 0 refills | Status: DC
Start: 2019-08-24 — End: 2019-09-26

## 2019-08-25 LAB — URINE CULTURE: Culture: NO GROWTH

## 2019-08-27 ENCOUNTER — Telehealth (HOSPITAL_COMMUNITY): Payer: Self-pay | Admitting: Orthopedic Surgery

## 2019-09-25 ENCOUNTER — Telehealth: Payer: Self-pay | Admitting: Adult Health

## 2019-09-25 NOTE — Telephone Encounter (Signed)

## 2019-09-26 ENCOUNTER — Other Ambulatory Visit (HOSPITAL_COMMUNITY)
Admission: RE | Admit: 2019-09-26 | Discharge: 2019-09-26 | Disposition: A | Discharge status: Home / Self Care | Payer: 59 | Source: Ambulatory Visit | Attending: Adult Health | Admitting: Adult Health

## 2019-09-26 ENCOUNTER — Encounter: Payer: Self-pay | Admitting: Adult Health

## 2019-09-26 ENCOUNTER — Ambulatory Visit: Payer: 59 | Admitting: Adult Health

## 2019-09-26 VITALS — BP 114/75 | HR 83 | Ht 63.0 in | Wt 138.0 lb

## 2019-09-26 DIAGNOSIS — I10 Essential (primary) hypertension: Secondary | ICD-10-CM | POA: Diagnosis not present

## 2019-09-26 DIAGNOSIS — Z8619 Personal history of other infectious and parasitic diseases: Secondary | ICD-10-CM | POA: Diagnosis not present

## 2019-09-26 MED ORDER — VALACYCLOVIR HCL 1 G PO TABS: 1000.0000 mg | ORAL_TABLET | Freq: Every day | ORAL | 99 refills | Status: DC

## 2019-09-26 MED ORDER — LOSARTAN POTASSIUM-HCTZ 50-12.5 MG PO TABS: 1.0000 | ORAL_TABLET | Freq: Every day | ORAL | 4 refills | Status: DC

## 2019-09-26 NOTE — Progress Notes (Signed)
  Subjective:     Patient ID: Shannon Lindsey, female   DOB: [birth year], 54 y.o.   MRN: [id number]  HPI Shannon Lindsey is a 54 year old white female, separated, PM in to get refills on med.  PCP is Dr Karie Kirks   Review of Systems Patient denies any headaches, hearing loss, fatigue, blurred vision, shortness of breath, chest pain, abdominal pain, problems with bowel movements, urination, or intercourse(not currently). No joint pain or mood swings, had felt down but better now, has been off Wellbutrin over 6 months.    Reviewed past medical,surgical, social and family history. Reviewed medications and allergies.  Objective:   Physical Exam BP 114/75 (BP Location: Left Arm, Patient Position: Sitting, Cuff Size: Normal)   Pulse 83   Ht 5\' 3"  (1.6 m)   Wt 138 lb (62.6 kg)   BMI 24.45 kg/m  Skin warm and dry.  Lungs: clear to ausculation bilaterally. Cardiovascular: regular rate and rhythm.   Fall risk is low PHQ 9 score is 0 She did self swab to check for POC trich  Assessment:     1. Essential hypertension Will refill meds Meds ordered this encounter  Medications  . losartan-hydrochlorothiazide (HYZAAR) 50-12.5 MG tablet    Sig: Take 1 tablet by mouth daily.    Dispense:  90 tablet    Refill:  4    Order Specific Question:   Supervising Provider    Answer:   Elonda Husky, LUTHER H [[id number]]  . valACYclovir (VALTREX) 1000 MG tablet    Sig: Take 1 tablet (1,000 mg total) by mouth daily.    Dispense:  90 tablet    Refill:  PRN    Order Specific Question:   Supervising Provider    Answer:   Tania Ade H [[id number]]    2. History of herpes simplex infection  3. History of trichomoniasis CV swab sent to POC recent trich    Plan:    CV swab sent Return in 7 months for physical Get mammogram now and yearly

## 2019-09-27 ENCOUNTER — Telehealth: Payer: Self-pay | Admitting: Adult Health

## 2019-09-27 LAB — CERVICOVAGINAL ANCILLARY ONLY
Comment: NEGATIVE
Trichomonas: NEGATIVE

## 2019-09-27 NOTE — Telephone Encounter (Signed)
Pt aware vaginal swab negative for trich

## 2020-01-09 ENCOUNTER — Other Ambulatory Visit (HOSPITAL_COMMUNITY): Payer: Self-pay | Admitting: Adult Health

## 2020-01-09 DIAGNOSIS — Z1231 Encounter for screening mammogram for malignant neoplasm of breast: Secondary | ICD-10-CM

## 2020-01-18 ENCOUNTER — Other Ambulatory Visit: Payer: Self-pay

## 2020-01-18 ENCOUNTER — Ambulatory Visit (INDEPENDENT_AMBULATORY_CARE_PROVIDER_SITE_OTHER): Payer: 59 | Admitting: Adult Health

## 2020-01-18 ENCOUNTER — Encounter: Payer: Self-pay | Admitting: Adult Health

## 2020-01-18 VITALS — BP 127/80 | HR 76 | Ht 62.5 in | Wt 138.0 lb

## 2020-01-18 DIAGNOSIS — Z8619 Personal history of other infectious and parasitic diseases: Secondary | ICD-10-CM

## 2020-01-18 DIAGNOSIS — Z01419 Encounter for gynecological examination (general) (routine) without abnormal findings: Secondary | ICD-10-CM

## 2020-01-18 DIAGNOSIS — I10 Essential (primary) hypertension: Secondary | ICD-10-CM

## 2020-01-18 DIAGNOSIS — Z1211 Encounter for screening for malignant neoplasm of colon: Secondary | ICD-10-CM | POA: Diagnosis not present

## 2020-01-18 LAB — HEMOCCULT GUIAC POC 1CARD (OFFICE): Fecal Occult Blood, POC: NEGATIVE

## 2020-01-18 MED ORDER — VALACYCLOVIR HCL 1 G PO TABS: 1000.0000 mg | ORAL_TABLET | ORAL | 3 refills | Status: DC | PRN

## 2020-01-18 MED ORDER — LOSARTAN POTASSIUM-HCTZ 50-12.5 MG PO TABS: 1.0000 | ORAL_TABLET | Freq: Every day | ORAL | 4 refills | Status: DC

## 2020-01-18 NOTE — Progress Notes (Signed)
Patient ID: Shannon Lindsey, female   DOB: 04/22/1965, 54 y.o.   MRN: [id number] History of Present Illness: Shannon Lindsey is a 54 year old white female,separated, PM in for a well woman gyn exam, she had a normal pap with negative HPV 04/24/2018. PCP is Dr Karie Kirks.   Current Medications, Allergies, Past Medical History, Past Surgical History, Family History and Social History were reviewed in [employer] record.     Review of Systems: Patient denies any headaches, hearing loss, fatigue, blurred vision, shortness of breath, chest pain, abdominal pain, problems with bowel movements, urination, or intercourse. No joint pain or mood swings.    Physical Exam:BP 127/80 (BP Location: Right Arm, Patient Position: Sitting, Cuff Size: Normal)   Pulse 76   Ht 5' 2.5" (1.588 m)   Wt 138 lb (62.6 kg)   BMI 24.84 kg/m  General:  Well developed, well nourished, no acute distress Skin:  Warm and dry Neck:  Midline trachea, normal thyroid, good ROM, no lymphadenopathy Lungs; Clear to auscultation bilaterally Breast:  No dominant palpable mass, retraction, or nipple discharge Cardiovascular: Regular rate and rhythm Abdomen:  Soft, non tender, no hepatosplenomegaly Pelvic:  External genitalia is normal in appearance, no lesions.  The vagina is pale with loss of moisture and rugae. Urethra has no lesions or masses. The cervix is smooth.  Uterus is felt to be normal size, shape, and contour.  No adnexal masses or tenderness noted.Bladder is non tender, no masses felt. Rectal: Good sphincter tone, no polyps, or hemorrhoids felt.  Hemoccult negative. Extremities/musculoskeletal:  No swelling or varicosities noted, no clubbing or cyanosis Psych:  No mood changes, alert and cooperative,seems happy AA is 1 Fall risk is low PHQ 9 score is 1  [facility] - 01/18/20 0947      Pregnancy Intention Screening   Does the patient want to become pregnant in the next year? N/A    Does the patient's  partner want to become pregnant in the next year? N/A    Would the patient like to discuss contraceptive options today? N/A      Contraception Wrap Up   Current Method Female Sterilization    End Method Female Sterilization    Contraception Counseling Provided No         Examination chaperoned by Levy Pupa LPN  Impression and Plan:   1. Encounter for well woman exam with routine gynecological exam Physical in 1 year  Pap in 2023 Mammogram 01/24/20 Colonoscopy per [facility] with PCP  2. Encounter for screening fecal occult blood testing  3. Essential hypertension Continue meds Meds ordered this encounter  Medications  . losartan-hydrochlorothiazide (HYZAAR) 50-12.5 MG tablet    Sig: Take 1 tablet by mouth daily.    Dispense:  90 tablet    Refill:  4    Order Specific Question:   Supervising Provider    Answer:   Elonda Husky, LUTHER H [[id number]]  . valACYclovir (VALTREX) 1000 MG tablet    Sig: Take 1 tablet (1,000 mg total) by mouth as needed.    Dispense:  30 tablet    Refill:  3    Order Specific Question:   Supervising Provider    Answer:   Elonda Husky, LUTHER H [[id number]]    4. History of herpes simplex infection Refilled valtrex

## 2020-01-23 ENCOUNTER — Ambulatory Visit
Admission: EM | Admit: 2020-01-23 | Discharge: 2020-01-23 | Disposition: A | Discharge status: Home / Self Care | Payer: 59 | Attending: Emergency Medicine | Admitting: Emergency Medicine

## 2020-01-23 ENCOUNTER — Other Ambulatory Visit: Payer: Self-pay

## 2020-01-23 DIAGNOSIS — R300 Dysuria: Secondary | ICD-10-CM | POA: Diagnosis not present

## 2020-01-23 DIAGNOSIS — N898 Other specified noninflammatory disorders of vagina: Secondary | ICD-10-CM | POA: Insufficient documentation

## 2020-01-23 DIAGNOSIS — Z202 Contact with and (suspected) exposure to infections with a predominantly sexual mode of transmission: Secondary | ICD-10-CM | POA: Diagnosis present

## 2020-01-23 LAB — POCT URINALYSIS DIP (MANUAL ENTRY)
Bilirubin, UA: NEGATIVE
Blood, UA: NEGATIVE
Glucose, UA: NEGATIVE mg/dL
Ketones, POC UA: NEGATIVE mg/dL
Nitrite, UA: NEGATIVE
Protein Ur, POC: NEGATIVE mg/dL
Spec Grav, UA: 1.025 (ref 1.010–1.025)
Urobilinogen, UA: 0.2 E.U./dL
pH, UA: 6.5 (ref 5.0–8.0)

## 2020-01-23 MED ORDER — METRONIDAZOLE 500 MG PO TABS: 500.0000 mg | ORAL_TABLET | Freq: Two times a day (BID) | ORAL | 0 refills | Status: DC

## 2020-01-23 NOTE — ED Triage Notes (Signed)
Pt presents with c/o vaginal irritation after sex with same partner she contracted tric from a couple months ago , wants treatment

## 2020-01-23 NOTE — Discharge Instructions (Signed)
vaginal self-swab obtained.  We will follow up with you regarding abnormal results Prescribed metronidazole 500 mg twice daily for 7 days (do not take while consuming alcohol and/or if breastfeeding) If tests results are positive, please abstain from sexual activity until you and your partner(s) have been treated Follow up with PCP or [facility] if symptoms persists Return here or go to ER if you have any new or worsening symptoms fever, chills, nausea, vomiting, abdominal or pelvic pain, painful intercourse, vaginal discharge, vaginal bleeding, persistent symptoms despite treatment, etc..Marland Kitchen

## 2020-01-23 NOTE — ED Provider Notes (Signed)
[place]   [id number] 01/23/20 Arrival Time: 1834   HC:[id number] DISCHARGE  SUBJECTIVE:  JEENA ARNETT is a 54 y.o. female who presented to the urgent care for complaint of vaginal irritation after sex and dysuria for the past couple days.  She denies a precipitating event, recent sexual encounter or recent antibiotic use.  Patient is sexually active with 1 female partner.  Currently denies vaginal discharge.  Has not tried any OTC medication.  Denies aggravating or alleviating factors.  She reports similar symptoms in the past and was diagnosed with trichomonas and treated accordingly.  She denies fever, chills, nausea, vomiting, abdominal or pelvic pain, urinary symptoms, vaginal itching, vaginal odor, vaginal bleeding, dyspareunia, vaginal rashes or lesions.   No LMP recorded. Patient is postmenopausal. Current birth control method: Compliant with BC:  ROS: As per HPI.  All other pertinent ROS negative.     Past Medical History:  Diagnosis Date  . Abnormal Pap smear   . Burning with urination 09/29/2015  . Cancer (HCC)    squamous cell chest  . Carpal tunnel syndrome, bilateral 09/29/2015  . Diverticulitis   . H/O hiatal hernia   . HSV-1 (herpes simplex virus 1) infection   . HSV-2 (herpes simplex virus 2) infection   . Hyperlipidemia   . Hypertension   . Vaginal dryness 07/17/2014  . Vaginal Pap smear, abnormal   . Vitamin D deficiency 09/09/2015  . Warts, genital    Past Surgical History:  Procedure Laterality Date  . CESAREAN SECTION    . COLONOSCOPY N/A 07/19/2016   Dr. Oneida Alar: 6 mm polyp, benign, diverticulosis involving rectosigmoid colon and sigmoid colon, small internal hemorrhoids.  Due to family history of colon cancer in a first-degree relative at early age, plans for next colonoscopy 5 years.  . COLONOSCOPY WITH ESOPHAGOGASTRODUODENOSCOPY (EGD)  2011   Dr. Oneida Alar: small hh, gastritis bx negative for h.pylori, diverticulosis of colon, two hyperplastic  colon polyps removed. TCS in 5 years due to father having colon cancer at age 71  . COLPOSCOPY W/ BIOPSY / CURETTAGE    . EXPLORATORY LAPAROTOMY    . MELANOMA EXCISION Right 12/25/2012   Procedure: wide EXCISION squamous cell carcinoma right chest ;  Surgeon: Zenovia Jarred, MD;  Location: [facility];  Service: General;  Laterality: Right;  . MOLE REMOVAL     has had 30 + removed not cancer  . TONSILLECTOMY    . TUBAL LIGATION     Allergies  Allergen Reactions  . Erythromycin Other (See Comments)    Gets a UTI every time.  . Lisinopril Other (See Comments)    cough  . Phenergan [Promethazine Hcl]     25 m iv made her feel jittery and nauseated. TOLERATED 6.25 MG IV.   No current facility-administered medications on file prior to encounter.   Current Outpatient Medications on File Prior to Encounter  Medication Sig Dispense Refill  . losartan-hydrochlorothiazide (HYZAAR) 50-12.5 MG tablet Take 1 tablet by mouth daily. 90 tablet 4  . valACYclovir (VALTREX) 1000 MG tablet Take 1 tablet (1,000 mg total) by mouth as needed. 30 tablet 3    Social History   Socioeconomic History  . Marital status: Legally Separated    Spouse name: Not on file  . Number of children: Not on file  . Years of education: Not on file  . Highest education level: Not on file  Occupational History  . Not on file  Tobacco Use  . Smoking status: Never  Smoker  . Smokeless tobacco: Never Used  Vaping Use  . Vaping Use: Never used  Substance and Sexual Activity  . Alcohol use: Yes    Comment: occ.  . Drug use: No  . Sexual activity: Yes    Birth control/protection: Post-menopausal, Surgical    Comment: tubal  Other Topics Concern  . Not on file  Social History Narrative  . Not on file   Social Determinants of Health   Financial Resource Strain: Low Risk   . Difficulty of Paying Living Expenses: Not very hard  Food Insecurity: No Food Insecurity  . Worried About Charity fundraiser in the Last Year:  Never true  . Ran Out of Food in the Last Year: Never true  Transportation Needs: No Transportation Needs  . Lack of Transportation (Medical): No  . Lack of Transportation (Non-Medical): No  Physical Activity: Sufficiently Active  . Days of Exercise per Week: 3 days  . Minutes of Exercise per Session: 60 min  Stress: No Stress Concern Present  . Feeling of Stress : Only a little  Social Connections: Moderately Isolated  . Frequency of Communication with Friends and Family: More than three times a week  . Frequency of Social Gatherings with Friends and Family: More than three times a week  . Attends Religious Services: 1 to 4 times per year  . Active Member of Clubs or Organizations: No  . Attends Archivist Meetings: Never  . Marital Status: Separated  Intimate Partner Violence: Not At Risk  . Fear of Current or Ex-Partner: No  . Emotionally Abused: No  . Physically Abused: No  . Sexually Abused: No   Family History  Problem Relation Age of Onset  . Cancer Father        colon and lung  . COPD Father   . Colon cancer Father 70  . Atrial fibrillation Mother   . Diabetes Mother   . Hyperlipidemia Mother   . Hypertension Mother   . Stroke Sister   . Heart attack Brother   . Other Daughter        benign pituitary tumor  . Ovarian cancer Maternal Aunt     OBJECTIVE:  There were no vitals filed for this visit.   General appearance: Alert, NAD, appears stated age Head: NCAT Throat: lips, mucosa, and tongue normal; teeth and gums normal Lungs: CTA bilaterally without adventitious breath sounds Heart: regular rate and rhythm.  Radial pulses 2+ symmetrical bilaterally Back: no CVA tenderness Abdomen: soft, non-tender; bowel sounds normal; no masses or organomegaly; no guarding or rebound tenderness GU: declines, vaginal self swab obtained Skin: warm and dry Psychological:  Alert and cooperative. Normal mood and affect.  LABS:  Results for orders placed or  performed during the hospital encounter of 01/23/20  POCT urinalysis dipstick  Result Value Ref Range   Color, UA yellow yellow   Clarity, UA clear clear   Glucose, UA negative negative mg/dL   Bilirubin, UA negative negative   Ketones, POC UA negative negative mg/dL   Spec Grav, UA 1.025 1.010 - 1.025   Blood, UA negative negative   pH, UA 6.5 5.0 - 8.0   Protein Ur, POC negative negative mg/dL   Urobilinogen, UA 0.2 0.2 or 1.0 E.U./dL   Nitrite, UA Negative Negative   Leukocytes, UA Trace (A) Negative    Labs Reviewed  POCT URINALYSIS DIP (MANUAL ENTRY) - Abnormal; Notable for the following components:      Result Value  Leukocytes, UA Trace (*)    All other components within normal limits  URINE CULTURE  CERVICOVAGINAL ANCILLARY ONLY    ASSESSMENT & PLAN:  1. Vaginal irritation   2. Dysuria   3. Trichomonas contact, treated     Meds ordered this encounter  Medications  . metroNIDAZOLE (FLAGYL) 500 MG tablet    Sig: Take 1 tablet (500 mg total) by mouth 2 (two) times daily.    Dispense:  14 tablet    Refill:  0    Pending: Labs Reviewed  POCT URINALYSIS DIP (MANUAL ENTRY) - Abnormal; Notable for the following components:      Result Value   Leukocytes, UA Trace (*)    All other components within normal limits  URINE CULTURE  CERVICOVAGINAL ANCILLARY ONLY   Discharge instructions   vaginal self-swab obtained.  We will follow up with you regarding abnormal results Prescribed metronidazole 500 mg twice daily for 7 days (do not take while consuming alcohol and/or if breastfeeding) If tests results are positive, please abstain from sexual activity until you and your partner(s) have been treated Follow up with PCP or [facility] if symptoms persists Return here or go to ER if you have any new or worsening symptoms fever, chills, nausea, vomiting, abdominal or pelvic pain, painful intercourse, vaginal discharge, vaginal bleeding, persistent symptoms despite  treatment, etc...  Reviewed expectations re: course of current medical issues. Questions answered. Outlined signs and symptoms indicating need for more acute intervention. Patient verbalized understanding. After Visit Summary given.       Emerson Monte, FNP 01/23/20 1914

## 2020-01-24 ENCOUNTER — Ambulatory Visit (HOSPITAL_COMMUNITY): Payer: 59

## 2020-01-24 LAB — CERVICOVAGINAL ANCILLARY ONLY
Bacterial Vaginitis (gardnerella): POSITIVE — AB
Candida Glabrata: NEGATIVE
Candida Vaginitis: NEGATIVE
Chlamydia: NEGATIVE
Comment: NEGATIVE
Comment: NEGATIVE
Comment: NEGATIVE
Comment: NEGATIVE
Comment: NEGATIVE
Comment: NORMAL
Neisseria Gonorrhea: NEGATIVE
Trichomonas: NEGATIVE

## 2020-01-25 LAB — URINE CULTURE: Culture: 20000 — AB

## 2020-01-31 ENCOUNTER — Other Ambulatory Visit: Payer: Self-pay | Admitting: Adult Health

## 2020-05-05 ENCOUNTER — Ambulatory Visit
Admission: EM | Admit: 2020-05-05 | Discharge: 2020-05-05 | Disposition: A | Discharge status: Home / Self Care | Payer: 59 | Attending: Emergency Medicine | Admitting: Emergency Medicine

## 2020-05-05 DIAGNOSIS — N898 Other specified noninflammatory disorders of vagina: Secondary | ICD-10-CM | POA: Insufficient documentation

## 2020-05-05 DIAGNOSIS — Z202 Contact with and (suspected) exposure to infections with a predominantly sexual mode of transmission: Secondary | ICD-10-CM | POA: Insufficient documentation

## 2020-05-05 LAB — POCT URINALYSIS DIP (MANUAL ENTRY)
Bilirubin, UA: NEGATIVE
Blood, UA: NEGATIVE
Glucose, UA: NEGATIVE mg/dL
Ketones, POC UA: NEGATIVE mg/dL
Leukocytes, UA: NEGATIVE
Nitrite, UA: NEGATIVE
Protein Ur, POC: NEGATIVE mg/dL
Spec Grav, UA: 1.02 (ref 1.010–1.025)
Urobilinogen, UA: 0.2 E.U./dL
pH, UA: 5.5 (ref 5.0–8.0)

## 2020-05-05 MED ORDER — FLUCONAZOLE 150 MG PO TABS: 150.0000 mg | ORAL_TABLET | Freq: Once | ORAL | 0 refills | Status: AC

## 2020-05-05 MED ORDER — METRONIDAZOLE 500 MG PO TABS: 500.0000 mg | ORAL_TABLET | Freq: Two times a day (BID) | ORAL | 0 refills | Status: DC

## 2020-05-05 NOTE — ED Triage Notes (Signed)
Patient presents to Urgent Care with complaints of vaginal irritation since 3 days ago. Reports Denies any other symptoms. She shares concerns possible STD.   Denies fever, abdominal pain, or  Hematuria.

## 2020-05-05 NOTE — ED Provider Notes (Signed)
[place]   [id number] 05/05/20 Arrival Time: 0806   Chief Complaint  Patient presents with  . vaginal irritation     SUBJECTIVE:  Shannon Lindsey is a 55 y.o. female who presented to the urgent care for complaint of vaginal irritation for the past 3 days.  She denies a precipitating event, recent sexual encounter or recent antibiotic use.  Patient is sexually active with 1 female partners.  Denies any vaginal discharge.  She has tried OTC medications without relief.  She reports worsening symptoms in the past and was diagnosed with BV.  She denies fever, chills, nausea, vomiting, abdominal or pelvic pain, urinary symptoms, vaginal itching, vaginal odor, vaginal bleeding, dyspareunia, vaginal rashes or lesions.   No LMP recorded. Patient is postmenopausal. Current birth control method: Compliant with BC:  ROS: As per HPI.  All other pertinent ROS negative.     Past Medical History:  Diagnosis Date  . Abnormal Pap smear   . Burning with urination 09/29/2015  . Cancer (HCC)    squamous cell chest  . Carpal tunnel syndrome, bilateral 09/29/2015  . Diverticulitis   . H/O hiatal hernia   . HSV-1 (herpes simplex virus 1) infection   . HSV-2 (herpes simplex virus 2) infection   . Hyperlipidemia   . Hypertension   . Vaginal dryness 07/17/2014  . Vaginal Pap smear, abnormal   . Vitamin D deficiency 09/09/2015  . Warts, genital    Past Surgical History:  Procedure Laterality Date  . CESAREAN SECTION    . COLONOSCOPY N/A 07/19/2016   Dr. Oneida Alar: 6 mm polyp, benign, diverticulosis involving rectosigmoid colon and sigmoid colon, small internal hemorrhoids.  Due to family history of colon cancer in a first-degree relative at early age, plans for next colonoscopy 5 years.  . COLONOSCOPY WITH ESOPHAGOGASTRODUODENOSCOPY (EGD)  2011   Dr. Oneida Alar: small hh, gastritis bx negative for h.pylori, diverticulosis of colon, two hyperplastic colon polyps removed. TCS in 5 years due to father  having colon cancer at age 48  . COLPOSCOPY W/ BIOPSY / CURETTAGE    . EXPLORATORY LAPAROTOMY    . MELANOMA EXCISION Right 12/25/2012   Procedure: wide EXCISION squamous cell carcinoma right chest ;  Surgeon: Zenovia Jarred, MD;  Location: [facility];  Service: General;  Laterality: Right;  . MOLE REMOVAL     has had 30 + removed not cancer  . TONSILLECTOMY    . TUBAL LIGATION     Allergies  Allergen Reactions  . Erythromycin Other (See Comments)    Gets a UTI every time.  . Lisinopril Other (See Comments)    cough  . Phenergan [Promethazine Hcl]     25 m iv made her feel jittery and nauseated. TOLERATED 6.25 MG IV.   No current facility-administered medications on file prior to encounter.   Current Outpatient Medications on File Prior to Encounter  Medication Sig Dispense Refill  . losartan-hydrochlorothiazide (HYZAAR) 50-12.5 MG tablet Take 1 tablet by mouth daily. 90 tablet 4  . valACYclovir (VALTREX) 1000 MG tablet Take 1 tablet (1,000 mg total) by mouth daily as needed. 90 tablet 0    Social History   Socioeconomic History  . Marital status: Legally Separated    Spouse name: Not on file  . Number of children: Not on file  . Years of education: Not on file  . Highest education level: Not on file  Occupational History  . Not on file  Tobacco Use  . Smoking status: Never  Smoker  . Smokeless tobacco: Never Used  Vaping Use  . Vaping Use: Never used  Substance and Sexual Activity  . Alcohol use: Yes    Comment: occ.  . Drug use: No  . Sexual activity: Yes    Birth control/protection: Post-menopausal, Surgical    Comment: tubal  Other Topics Concern  . Not on file  Social History Narrative  . Not on file   Social Determinants of Health   Financial Resource Strain: Low Risk   . Difficulty of Paying Living Expenses: Not very hard  Food Insecurity: No Food Insecurity  . Worried About Charity fundraiser in the Last Year: Never true  . Ran Out of Food in the Last  Year: Never true  Transportation Needs: No Transportation Needs  . Lack of Transportation (Medical): No  . Lack of Transportation (Non-Medical): No  Physical Activity: Sufficiently Active  . Days of Exercise per Week: 3 days  . Minutes of Exercise per Session: 60 min  Stress: No Stress Concern Present  . Feeling of Stress : Only a little  Social Connections: Moderately Isolated  . Frequency of Communication with Friends and Family: More than three times a week  . Frequency of Social Gatherings with Friends and Family: More than three times a week  . Attends Religious Services: 1 to 4 times per year  . Active Member of Clubs or Organizations: No  . Attends Archivist Meetings: Never  . Marital Status: Separated  Intimate Partner Violence: Not At Risk  . Fear of Current or Ex-Partner: No  . Emotionally Abused: No  . Physically Abused: No  . Sexually Abused: No   Family History  Problem Relation Age of Onset  . Cancer Father        colon and lung  . COPD Father   . Colon cancer Father 55  . Atrial fibrillation Mother   . Diabetes Mother   . Hyperlipidemia Mother   . Hypertension Mother   . Stroke Sister   . Heart attack Brother   . Other Daughter        benign pituitary tumor  . Ovarian cancer Maternal Aunt     OBJECTIVE:  Vitals:   05/05/20 0828  BP: 127/84  Pulse: 68  Resp: 16  Temp: 98.6 F (37 C)  TempSrc: Oral  SpO2: 97%     General appearance: Alert, NAD, appears stated age Head: NCAT Throat: lips, mucosa, and tongue normal; teeth and gums normal Lungs: CTA bilaterally without adventitious breath sounds Heart: regular rate and rhythm.  Radial pulses 2+ symmetrical bilaterally Back: no CVA tenderness Abdomen: soft, non-tender; bowel sounds normal; no masses or organomegaly; no guarding or rebound tenderness GU: declines OR External examination without vulvar lesions or erythema Bimanual exam: Negative for cervical motion or adenexal tenderness;  Speculum exam: Thick white/yellow discharge during pelvic exam.  Cervix visualized without erythema or lesions. Cervical swab obtained Skin: warm and dry Psychological:  Alert and cooperative. Normal mood and affect.  LABS:  Results for orders placed or performed during the hospital encounter of 05/05/20  POCT urinalysis dipstick  Result Value Ref Range   Color, UA yellow yellow   Clarity, UA hazy (A) clear   Glucose, UA negative negative mg/dL   Bilirubin, UA negative negative   Ketones, POC UA negative negative mg/dL   Spec Grav, UA 1.020 1.010 - 1.025   Blood, UA negative negative   pH, UA 5.5 5.0 - 8.0   Protein Ur,  POC negative negative mg/dL   Urobilinogen, UA 0.2 0.2 or 1.0 E.U./dL   Nitrite, UA Negative Negative   Leukocytes, UA Negative Negative    Labs Reviewed  POCT URINALYSIS DIP (MANUAL ENTRY) - Abnormal; Notable for the following components:      Result Value   Clarity, UA hazy (*)    All other components within normal limits  URINE CULTURE  CERVICOVAGINAL ANCILLARY ONLY    ASSESSMENT & PLAN:  1. Vaginal irritation   2. STD exposure     Meds ordered this encounter  Medications  . metroNIDAZOLE (FLAGYL) 500 MG tablet    Sig: Take 1 tablet (500 mg total) by mouth 2 (two) times daily.    Dispense:  14 tablet    Refill:  0  . fluconazole (DIFLUCAN) 150 MG tablet    Sig: Take 1 tablet (150 mg total) by mouth once for 1 dose. May take the second dose 72 hours after the first if symptom does not resolve    Dispense:  2 tablet    Refill:  0    Pending: Labs Reviewed  POCT URINALYSIS DIP (MANUAL ENTRY) - Abnormal; Notable for the following components:      Result Value   Clarity, UA hazy (*)    All other components within normal limits  URINE CULTURE  CERVICOVAGINAL ANCILLARY ONLY    Patient is stable at discharge.  We will go ahead and treat her for possible BV and yeast infection.  Will await cervical ancillary and urine culture tests   Discharge  Instructions  Vaginal self-swab obtained.  We will follow up with you regarding abnormal results Prescribed metronidazole 500 mg twice daily for 7 days (do not take while consuming alcohol and/or if breastfeeding) Prescribed diflucan 150 mg once daily and then second dose 72 hours later Take medications as prescribed and to completion If tests results are positive, please abstain from sexual activity until you and your partner(s) have been treated Follow up with PCP or [facility] if symptoms persists Return here or go to ER if you have any new or worsening symptoms fever, chills, nausea, vomiting, abdominal or pelvic pain, painful intercourse, vaginal discharge, vaginal bleeding, persistent symptoms despite treatment, etc...  Reviewed expectations re: course of current medical issues. Questions answered. Outlined signs and symptoms indicating need for more acute intervention. Patient verbalized understanding. After Visit Summary given.       Emerson Monte, [place] 05/05/20 [phone]

## 2020-05-05 NOTE — Discharge Instructions (Signed)
Vaginal self-swab obtained.  We will follow up with you regarding abnormal results Prescribed metronidazole 500 mg twice daily for 7 days (do not take while consuming alcohol and/or if breastfeeding) Prescribed diflucan 150 mg once daily and then second dose 72 hours later Take medications as prescribed and to completion If tests results are positive, please abstain from sexual activity until you and your partner(s) have been treated Follow up with PCP or [facility] if symptoms persists Return here or go to ER if you have any new or worsening symptoms fever, chills, nausea, vomiting, abdominal or pelvic pain, painful intercourse, vaginal discharge, vaginal bleeding, persistent symptoms despite treatment, etc..Marland Kitchen

## 2020-05-06 LAB — CERVICOVAGINAL ANCILLARY ONLY
Bacterial Vaginitis (gardnerella): POSITIVE — AB
Candida Glabrata: NEGATIVE
Candida Vaginitis: NEGATIVE
Chlamydia: NEGATIVE
Comment: NEGATIVE
Comment: NEGATIVE
Comment: NEGATIVE
Comment: NEGATIVE
Comment: NEGATIVE
Comment: NORMAL
Neisseria Gonorrhea: NEGATIVE
Trichomonas: NEGATIVE

## 2020-05-07 LAB — URINE CULTURE: Culture: NO GROWTH

## 2020-05-10 ENCOUNTER — Ambulatory Visit
Admission: EM | Admit: 2020-05-10 | Discharge: 2020-05-10 | Disposition: A | Discharge status: Home / Self Care | Payer: 59 | Attending: Emergency Medicine | Admitting: Emergency Medicine

## 2020-05-10 ENCOUNTER — Encounter: Payer: Self-pay | Admitting: Emergency Medicine

## 2020-05-10 ENCOUNTER — Other Ambulatory Visit: Payer: Self-pay

## 2020-05-10 DIAGNOSIS — K5792 Diverticulitis of intestine, part unspecified, without perforation or abscess without bleeding: Secondary | ICD-10-CM

## 2020-05-10 MED ORDER — AMOXICILLIN-POT CLAVULANATE 875-125 MG PO TABS: 1.0000 | ORAL_TABLET | Freq: Two times a day (BID) | ORAL | 0 refills | Status: DC

## 2020-05-10 NOTE — ED Triage Notes (Signed)
Reports abd pain to LLQ x 3 days.  Pt has hx of diverticulitis and this feels the same.  Pt is currently on flagyl for something else and has 4 pills left.  She normally gets flagyl and cipro for diverticulitis.

## 2020-05-10 NOTE — Discharge Instructions (Addendum)
Continue to finish metronidazole as prescribed Augmentin was prescribed/take as directed Follow-up with PCP Return or go to ED if you develop any new or worsening of your symptoms

## 2020-05-10 NOTE — ED Provider Notes (Signed)
[place]   [id number] 05/10/20 Arrival Time: 3299  CC: ABDOMINAL DISCOMFORT  SUBJECTIVE:  Shannon Lindsey is a 55 y.o. female with history of diverticulitis presented to the urgent care with a complaint of left lower quadrant abdominal pain for the past 3 days.  Denies a precipitating event.  Localizes pain to left lower quadrant.  Describes as intermittent aching character.  Has currently been taking metronidazole for BV.  Denies alleviating or aggravating factors.  Reports similar symptoms in the past that resolved with metronidazole and Augmentin.  She states her symptoms feel the same as previous acute diverticulitis. Denies fever, chills, appetite changes, weight changes, nausea, vomiting, chest pain, SOB, hematochezia, melena, dysuria, difficulty urinating, increased frequency or urgency, flank pain, loss of bowel or bladder function, pelvic pain.     No LMP recorded. Patient is postmenopausal.  ROS: As per HPI.  All other pertinent ROS negative.     Past Medical History:  Diagnosis Date   Abnormal Pap smear    Burning with urination 09/29/2015   Cancer (HCC)    squamous cell chest   Carpal tunnel syndrome, bilateral 09/29/2015   Diverticulitis    H/O hiatal hernia    HSV-1 (herpes simplex virus 1) infection    HSV-2 (herpes simplex virus 2) infection    Hyperlipidemia    Hypertension    Vaginal dryness 07/17/2014   Vaginal Pap smear, abnormal    Vitamin D deficiency 09/09/2015   Warts, genital    Past Surgical History:  Procedure Laterality Date   CESAREAN SECTION     COLONOSCOPY N/A 07/19/2016   Dr. Oneida Alar: 6 mm polyp, benign, diverticulosis involving rectosigmoid colon and sigmoid colon, small internal hemorrhoids.  Due to family history of colon cancer in a first-degree relative at early age, plans for next colonoscopy 5 years.   COLONOSCOPY WITH ESOPHAGOGASTRODUODENOSCOPY (EGD)  2011   Dr. Oneida Alar: small hh, gastritis bx negative for h.pylori,  diverticulosis of colon, two hyperplastic colon polyps removed. TCS in 5 years due to father having colon cancer at age 10   COLPOSCOPY W/ BIOPSY / CURETTAGE     EXPLORATORY LAPAROTOMY     MELANOMA EXCISION Right 12/25/2012   Procedure: wide EXCISION squamous cell carcinoma right chest ;  Surgeon: Zenovia Jarred, MD;  Location: [facility];  Service: General;  Laterality: Right;   MOLE REMOVAL     has had 30 + removed not cancer   TONSILLECTOMY     TUBAL LIGATION     Allergies  Allergen Reactions   Erythromycin Other (See Comments)    Gets a UTI every time.   Lisinopril Other (See Comments)    cough   Phenergan [Promethazine Hcl]     25 m iv made her feel jittery and nauseated. TOLERATED 6.25 MG IV.   No current facility-administered medications on file prior to encounter.   Current Outpatient Medications on File Prior to Encounter  Medication Sig Dispense Refill   losartan-hydrochlorothiazide (HYZAAR) 50-12.5 MG tablet Take 1 tablet by mouth daily. 90 tablet 4   metroNIDAZOLE (FLAGYL) 500 MG tablet Take 1 tablet (500 mg total) by mouth 2 (two) times daily. 14 tablet 0   valACYclovir (VALTREX) 1000 MG tablet Take 1 tablet (1,000 mg total) by mouth daily as needed. 90 tablet 0   Social History   Socioeconomic History   Marital status: Legally Separated    Spouse name: Not on file   Number of children: Not on file   Years of  education: Not on file   Highest education level: Not on file  Occupational History   Not on file  Tobacco Use   Smoking status: Never Smoker   Smokeless tobacco: Never Used  Vaping Use   Vaping Use: Never used  Substance and Sexual Activity   Alcohol use: Yes    Comment: occ.   Drug use: No   Sexual activity: Yes    Birth control/protection: Post-menopausal, Surgical    Comment: tubal  Other Topics Concern   Not on file  Social History Narrative   Not on file   Social Determinants of Health   Financial Resource Strain:  Low Risk    Difficulty of Paying Living Expenses: Not very hard  Food Insecurity: No Food Insecurity   Worried About Running Out of Food in the Last Year: Never true   Ran Out of Food in the Last Year: Never true  Transportation Needs: No Transportation Needs   Lack of Transportation (Medical): No   Lack of Transportation (Non-Medical): No  Physical Activity: Sufficiently Active   Days of Exercise per Week: 3 days   Minutes of Exercise per Session: 60 min  Stress: No Stress Concern Present   Feeling of Stress : Only a little  Social Connections: Moderately Isolated   Frequency of Communication with Friends and Family: More than three times a week   Frequency of Social Gatherings with Friends and Family: More than three times a week   Attends Religious Services: 1 to 4 times per year   Active Member of [employer] or Organizations: No   Attends Archivist Meetings: Never   Marital Status: Separated  Intimate Partner Violence: Not At Risk   Fear of Current or Ex-Partner: No   Emotionally Abused: No   Physically Abused: No   Sexually Abused: No   Family History  Problem Relation Age of Onset   Cancer Father        colon and lung   COPD Father    Colon cancer Father 83   Atrial fibrillation Mother    Diabetes Mother    Hyperlipidemia Mother    Hypertension Mother    Stroke Sister    Heart attack Brother    Other Daughter        benign pituitary tumor   Ovarian cancer Maternal Aunt      OBJECTIVE:  Vitals:   05/10/20 0845  BP: 131/74  Pulse: 67  Resp: 18  Temp: 98.8 F (37.1 C)  TempSrc: Oral  SpO2: 98%    General appearance: Alert; NAD HEENT: NCAT.  Oropharynx clear.  Lungs: clear to auscultation bilaterally without adventitious breath sounds Heart: regular rate and rhythm.  Radial pulses 2+ symmetrical bilaterally Abdomen: soft, non-distended; normal active bowel sounds; non-tender to light and deep palpation; nontender at  McBurney's point; negative Murphy's sign; negative rebound; no guarding Back: no CVA tenderness Extremities: no edema; symmetrical with no gross deformities Skin: warm and dry Neurologic: normal gait Psychological: alert and cooperative; normal mood and affect  LABS: No results found for this or any previous visit (from the past 24 hour(s)).  DIAGNOSTIC STUDIES: No results found.   ASSESSMENT & PLAN:  1. Acute diverticulitis     Meds ordered this encounter  Medications   amoxicillin-clavulanate (AUGMENTIN) 875-125 MG tablet    Sig: Take 1 tablet by mouth every 12 (twelve) hours.    Dispense:  14 tablet    Refill:  0     Discharge instructions  Continue  to finish metronidazole as prescribed Augmentin was prescribed/take as directed Follow-up with PCP Return or go to ED if you develop any new or worsening of your symptoms  If you experience new or worsening symptoms return or go to ER such as fever, chills, nausea, vomiting, diarrhea, bloody or dark tarry stools, constipation, urinary symptoms, worsening abdominal discomfort, symptoms that do not improve with medications, inability to keep fluids down, etc...  Reviewed expectations re: course of current medical issues. Questions answered. Outlined signs and symptoms indicating need for more acute intervention. Patient verbalized understanding. After Visit Summary given.   Emerson Monte, [place] 05/10/20 [phone]

## 2020-06-23 ENCOUNTER — Other Ambulatory Visit: Payer: Self-pay | Admitting: Women's Health

## 2020-07-01 ENCOUNTER — Other Ambulatory Visit: Payer: Self-pay | Admitting: Adult Health

## 2020-08-27 ENCOUNTER — Other Ambulatory Visit: Payer: Self-pay

## 2020-08-27 ENCOUNTER — Ambulatory Visit
Admission: EM | Admit: 2020-08-27 | Discharge: 2020-08-27 | Disposition: A | Discharge status: Home / Self Care | Payer: 59 | Attending: Family Medicine | Admitting: Family Medicine

## 2020-08-27 DIAGNOSIS — R1032 Left lower quadrant pain: Secondary | ICD-10-CM

## 2020-08-27 DIAGNOSIS — K5732 Diverticulitis of large intestine without perforation or abscess without bleeding: Secondary | ICD-10-CM

## 2020-08-27 DIAGNOSIS — R1031 Right lower quadrant pain: Secondary | ICD-10-CM

## 2020-08-27 MED ORDER — FLUCONAZOLE 150 MG PO TABS: ORAL_TABLET | ORAL | 0 refills | Status: DC

## 2020-08-27 MED ORDER — METRONIDAZOLE 500 MG PO TABS: 500.0000 mg | ORAL_TABLET | Freq: Two times a day (BID) | ORAL | 0 refills | Status: DC

## 2020-08-27 MED ORDER — CIPROFLOXACIN HCL 500 MG PO TABS: 500.0000 mg | ORAL_TABLET | Freq: Two times a day (BID) | ORAL | 0 refills | Status: DC

## 2020-08-27 NOTE — ED Provider Notes (Signed)
[place]   [id number] 08/27/20 Arrival Time: 0913  CC: ABDOMINAL PAIN  SUBJECTIVE:  Shannon Lindsey is a 55 y.o. female who presents with LLQ pain that radiates across the abdomen. States that she has been eating nuts lately and thinks that her symptoms may be attributed to this. Denies a precipitating event, trauma, close contacts with similar symptoms, recent travel or antibiotic use. Reports abdominal cramping. Has not taken OTC medications for this. Denies alleviating or aggravating factors. Denies similar symptoms in the past. Last BM today.    Denies fever, chills, appetite changes, weight changes, vomiting, chest pain, SOB, diarrhea, constipation, hematochezia, melena, dysuria, difficulty urinating, increased frequency or urgency, flank pain, loss of bowel or bladder function, vaginal discharge, vaginal odor, vaginal bleeding, dyspareunia, pelvic pain.     No LMP recorded. Patient is postmenopausal.  ROS: As per HPI.  All other pertinent ROS negative.     Past Medical History:  Diagnosis Date  . Abnormal Pap smear   . Burning with urination 09/29/2015  . Cancer (HCC)    squamous cell chest  . Carpal tunnel syndrome, bilateral 09/29/2015  . Diverticulitis   . H/O hiatal hernia   . HSV-1 (herpes simplex virus 1) infection   . HSV-2 (herpes simplex virus 2) infection   . Hyperlipidemia   . Hypertension   . Vaginal dryness 07/17/2014  . Vaginal Pap smear, abnormal   . Vitamin D deficiency 09/09/2015  . Warts, genital    Past Surgical History:  Procedure Laterality Date  . CESAREAN SECTION    . COLONOSCOPY N/A 07/19/2016   Dr. Oneida Alar: 6 mm polyp, benign, diverticulosis involving rectosigmoid colon and sigmoid colon, small internal hemorrhoids.  Due to family history of colon cancer in a first-degree relative at early age, plans for next colonoscopy 5 years.  . COLONOSCOPY WITH ESOPHAGOGASTRODUODENOSCOPY (EGD)  2011   Dr. Oneida Alar: small hh, gastritis bx negative for  h.pylori, diverticulosis of colon, two hyperplastic colon polyps removed. TCS in 5 years due to father having colon cancer at age 86  . COLPOSCOPY W/ BIOPSY / CURETTAGE    . EXPLORATORY LAPAROTOMY    . MELANOMA EXCISION Right 12/25/2012   Procedure: wide EXCISION squamous cell carcinoma right chest ;  Surgeon: Zenovia Jarred, MD;  Location: [facility];  Service: General;  Laterality: Right;  . MOLE REMOVAL     has had 30 + removed not cancer  . TONSILLECTOMY    . TUBAL LIGATION     Allergies  Allergen Reactions  . Erythromycin Other (See Comments)    Gets a UTI every time.  . Lisinopril Other (See Comments)    cough  . Phenergan [Promethazine Hcl]     25 m iv made her feel jittery and nauseated. TOLERATED 6.25 MG IV.   No current facility-administered medications on file prior to encounter.   Current Outpatient Medications on File Prior to Encounter  Medication Sig Dispense Refill  . losartan-hydrochlorothiazide (HYZAAR) 50-12.5 MG tablet Take 1 tablet by mouth daily. 90 tablet 4  . valACYclovir (VALTREX) 1000 MG tablet TAKE 1 TABLET (1,000 MG TOTAL) BY MOUTH DAILY AS NEEDED. 90 tablet 3   Social History   Socioeconomic History  . Marital status: Legally Separated    Spouse name: Not on file  . Number of children: Not on file  . Years of education: Not on file  . Highest education level: Not on file  Occupational History  . Not on file  Tobacco Use  .  Smoking status: Never Smoker  . Smokeless tobacco: Never Used  Vaping Use  . Vaping Use: Never used  Substance and Sexual Activity  . Alcohol use: Yes    Comment: occ.  . Drug use: No  . Sexual activity: Yes    Birth control/protection: Post-menopausal, Surgical    Comment: tubal  Other Topics Concern  . Not on file  Social History Narrative  . Not on file   Social Determinants of Health   Financial Resource Strain: Low Risk   . Difficulty of Paying Living Expenses: Not very hard  Food Insecurity: No Food Insecurity   . Worried About Charity fundraiser in the Last Year: Never true  . Ran Out of Food in the Last Year: Never true  Transportation Needs: No Transportation Needs  . Lack of Transportation (Medical): No  . Lack of Transportation (Non-Medical): No  Physical Activity: Sufficiently Active  . Days of Exercise per Week: 3 days  . Minutes of Exercise per Session: 60 min  Stress: No Stress Concern Present  . Feeling of Stress : Only a little  Social Connections: Moderately Isolated  . Frequency of Communication with Friends and Family: More than three times a week  . Frequency of Social Gatherings with Friends and Family: More than three times a week  . Attends Religious Services: 1 to 4 times per year  . Active Member of Clubs or Organizations: No  . Attends Archivist Meetings: Never  . Marital Status: Separated  Intimate Partner Violence: Not At Risk  . Fear of Current or Ex-Partner: No  . Emotionally Abused: No  . Physically Abused: No  . Sexually Abused: No   Family History  Problem Relation Age of Onset  . Cancer Father        colon and lung  . COPD Father   . Colon cancer Father 61  . Atrial fibrillation Mother   . Diabetes Mother   . Hyperlipidemia Mother   . Hypertension Mother   . Stroke Sister   . Heart attack Brother   . Other Daughter        benign pituitary tumor  . Ovarian cancer Maternal Aunt      OBJECTIVE:  Vitals:   08/27/20 0931  BP: 118/81  Pulse: 77  Resp: 20  Temp: 98.2 F (36.8 C)  SpO2: 97%    General appearance: Alert; NAD HEENT: NCAT.  Oropharynx clear.  Lungs: clear to auscultation bilaterally without adventitious breath sounds Heart: regular rate and rhythm.  Radial pulses 2+ symmetrical bilaterally Abdomen: soft, non-distended; normal active bowel sounds; tenderness to LLQ with deep palpation; nontender at McBurney's point; negative Murphy's sign; negative rebound; no guarding Back: no CVA tenderness Extremities: no edema;  symmetrical with no gross deformities Skin: warm and dry Neurologic: normal gait Psychological: alert and cooperative; normal mood and affect  LABS: No results found for this or any previous visit (from the past 24 hour(s)).  DIAGNOSTIC STUDIES: No results found.   ASSESSMENT & PLAN:  1. RLQ abdominal pain   2. Diverticulitis of colon     Meds ordered this encounter  Medications  . metroNIDAZOLE (FLAGYL) 500 MG tablet    Sig: Take 1 tablet (500 mg total) by mouth 2 (two) times daily.    Dispense:  14 tablet    Refill:  0    Order Specific Question:   Supervising Provider    Answer:   Chase Picket [id number]  . ciprofloxacin (CIPRO) 500 MG  tablet    Sig: Take 1 tablet (500 mg total) by mouth 2 (two) times daily.    Dispense:  14 tablet    Refill:  0    Order Specific Question:   Supervising Provider    Answer:   Chase Picket [id number]  . fluconazole (DIFLUCAN) 150 MG tablet    Sig: Take one tablet at the onset of symptoms, if still having symptoms in 3 days, take the second tablet.    Dispense:  2 tablet    Refill:  0    Order Specific Question:   Supervising Provider    Answer:   Chase Picket [id number]    Prescribed flagyl 500mg  BID x 7 days Prescribed Cipro 500mg  BID x 7 days Prescribed fluconazole in case of yeast If you experience new or worsening symptoms return or go to ER such as fever, chills, nausea, vomiting, diarrhea, bloody or dark tarry stools, constipation, urinary symptoms, worsening abdominal discomfort, symptoms that do not improve with medications, inability to keep fluids down.  Reviewed expectations re: course of current medical issues. Questions answered. Outlined signs and symptoms indicating need for more acute intervention. Patient verbalized understanding. After Visit Summary given.   Faustino Congress, NP 08/27/20 1453

## 2020-08-27 NOTE — Discharge Instructions (Signed)
I have sent in Cipro for you to take twice a day for 7 days  I have prescribed metronidazole for you to take twice a day for 7 days. Do not drink alcohol while taking this medication.  I have sent in fluconazole in case of yeast. Take one tablet at the onset of symptoms. If symptoms are still present in 3 days, take the second tablet.   Follow up with this office or with primary care if symptoms are persisting.  Follow up in the ER for high fever, trouble swallowing, trouble breathing, other concerning symptoms.

## 2020-08-27 NOTE — ED Triage Notes (Signed)
Pt presents with left lower quadrant pain that began about 3 days ago, states she has h/o diverticulitis

## 2020-10-08 ENCOUNTER — Ambulatory Visit (HOSPITAL_COMMUNITY)
Admission: RE | Admit: 2020-10-08 | Discharge: 2020-10-08 | Disposition: A | Discharge status: Home / Self Care | Payer: 59 | Source: Ambulatory Visit | Attending: Adult Health | Admitting: Adult Health

## 2020-10-08 ENCOUNTER — Other Ambulatory Visit: Payer: Self-pay

## 2020-10-08 DIAGNOSIS — Z1231 Encounter for screening mammogram for malignant neoplasm of breast: Secondary | ICD-10-CM | POA: Diagnosis not present

## 2020-12-10 ENCOUNTER — Ambulatory Visit: Payer: 59 | Admitting: Internal Medicine

## 2020-12-10 ENCOUNTER — Encounter: Payer: Self-pay | Admitting: Internal Medicine

## 2021-01-18 ENCOUNTER — Other Ambulatory Visit: Payer: Self-pay | Admitting: Adult Health

## 2021-02-25 ENCOUNTER — Other Ambulatory Visit: Payer: Self-pay

## 2021-02-25 ENCOUNTER — Emergency Department (HOSPITAL_COMMUNITY)
Admission: EM | Admit: 2021-02-25 | Discharge: 2021-02-25 | Disposition: A | Discharge status: Home / Self Care | Payer: Self-pay | Attending: Emergency Medicine | Admitting: Emergency Medicine

## 2021-02-25 ENCOUNTER — Encounter (HOSPITAL_COMMUNITY): Payer: Self-pay

## 2021-02-25 DIAGNOSIS — R1032 Left lower quadrant pain: Secondary | ICD-10-CM | POA: Insufficient documentation

## 2021-02-25 DIAGNOSIS — Z79899 Other long term (current) drug therapy: Secondary | ICD-10-CM | POA: Insufficient documentation

## 2021-02-25 DIAGNOSIS — Z85828 Personal history of other malignant neoplasm of skin: Secondary | ICD-10-CM | POA: Insufficient documentation

## 2021-02-25 DIAGNOSIS — K5732 Diverticulitis of large intestine without perforation or abscess without bleeding: Secondary | ICD-10-CM

## 2021-02-25 DIAGNOSIS — I10 Essential (primary) hypertension: Secondary | ICD-10-CM | POA: Insufficient documentation

## 2021-02-25 LAB — COMPREHENSIVE METABOLIC PANEL
ALT: 19 U/L (ref 0–44)
AST: 17 U/L (ref 15–41)
Albumin: 4.2 g/dL (ref 3.5–5.0)
Alkaline Phosphatase: 56 U/L (ref 38–126)
Anion gap: 7 (ref 5–15)
BUN: 16 mg/dL (ref 6–20)
CO2: 27 mmol/L (ref 22–32)
Calcium: 8.9 mg/dL (ref 8.9–10.3)
Chloride: 102 mmol/L (ref 98–111)
Creatinine, Ser: 0.74 mg/dL (ref 0.44–1.00)
GFR, Estimated: >60 mL/min (ref >60)
Glucose, Bld: 106 mg/dL — ABNORMAL HIGH (ref 70–99)
Potassium: 4.1 mmol/L (ref 3.5–5.1)
Sodium: 136 mmol/L (ref 135–145)
Total Bilirubin: 0.4 mg/dL (ref 0.3–1.2)
Total Protein: 7 g/dL (ref 6.5–8.1)

## 2021-02-25 LAB — CBC WITH DIFFERENTIAL/PLATELET
Abs Immature Granulocytes: 0.02 10*3/uL (ref 0.00–0.07)
Basophils Absolute: 0 10*3/uL (ref 0.0–0.1)
Basophils Relative: 1 %
Eosinophils Absolute: 0.1 10*3/uL (ref 0.0–0.5)
Eosinophils Relative: 2 %
HCT: 45.4 % (ref 36.0–46.0)
Hemoglobin: 14.9 g/dL (ref 12.0–15.0)
Immature Granulocytes: 0 %
Lymphocytes Relative: 33 %
Lymphs Abs: 1.5 10*3/uL (ref 0.7–4.0)
MCH: 30.2 pg (ref 26.0–34.0)
MCHC: 32.8 g/dL (ref 30.0–36.0)
MCV: 92.1 fL (ref 80.0–100.0)
Monocytes Absolute: 0.4 10*3/uL (ref 0.1–1.0)
Monocytes Relative: 8 %
Neutro Abs: 2.5 10*3/uL (ref 1.7–7.7)
Neutrophils Relative %: 56 %
Platelets: 275 10*3/uL (ref 150–400)
RBC: 4.93 MIL/uL (ref 3.87–5.11)
RDW: 12.6 % (ref 11.5–15.5)
WBC: 4.5 10*3/uL (ref 4.0–10.5)
nRBC: 0 % (ref 0.0–0.2)

## 2021-02-25 LAB — URINALYSIS, ROUTINE W REFLEX MICROSCOPIC
Bilirubin Urine: NEGATIVE
Glucose, UA: NEGATIVE mg/dL
Hgb urine dipstick: NEGATIVE
Ketones, ur: NEGATIVE mg/dL
Leukocytes,Ua: NEGATIVE
Nitrite: NEGATIVE
Protein, ur: NEGATIVE mg/dL
Specific Gravity, Urine: 1.015 (ref 1.005–1.030)
pH: 5 (ref 5.0–8.0)

## 2021-02-25 LAB — LIPASE, BLOOD: Lipase: 37 U/L (ref 11–51)

## 2021-02-25 MED ORDER — METRONIDAZOLE 500 MG PO TABS: 500.0000 mg | ORAL_TABLET | Freq: Two times a day (BID) | ORAL | 0 refills | Status: DC

## 2021-02-25 MED ORDER — METRONIDAZOLE 500 MG PO TABS
500.0000 mg | ORAL_TABLET | Freq: Once | ORAL | Status: AC
  Administered 2021-02-25: 500 mg via ORAL
  Filled 2021-02-25: qty 1

## 2021-02-25 MED ORDER — CIPROFLOXACIN HCL 500 MG PO TABS: 500.0000 mg | ORAL_TABLET | Freq: Two times a day (BID) | ORAL | 0 refills | Status: DC

## 2021-02-25 MED ORDER — CIPROFLOXACIN HCL 250 MG PO TABS
500.0000 mg | ORAL_TABLET | Freq: Once | ORAL | Status: AC
  Administered 2021-02-25: 500 mg via ORAL
  Filled 2021-02-25: qty 2

## 2021-02-25 NOTE — ED Triage Notes (Signed)
Pov from home with cc of left flank pain on and off for a month or so. No other s/s

## 2021-02-25 NOTE — Discharge Instructions (Addendum)
As discussed, with your abdominal pain, change in bowel movements and history of diverticular disease there is suspicion for recurrence of diverticulitis.  Please take all medication to completion and schedule follow-up with both your primary care physician and our gastroenterology colleagues.  Return here for concerning changes in your condition

## 2021-02-25 NOTE — ED Provider Notes (Signed)
[facility] Provider Note   CSN: [id number] Arrival date & time: 02/25/21  1607     History No chief complaint on file.   Shannon Lindsey is a 55 y.o. female.  HPI Patient presents with concern of months of intermittent left flank and left inguinal discomfort.  She has a history of diverticulitis, last colonoscopy 4 years ago.  She notes that she was diagnosed with polyps and had what sounds to be 1 prominent diverticular pouch at that point.  Now over the past few months she has had episodes of intermittent left abdominal pain.  Pain seems to be better with defecation, though not consistently.  No other clear alleviating or exacerbating factors.  No fever, no other abdominal pain pain is sore. Patient is seen in urgent care, try to see her physician, has not followed up with GI since onset.    Past Medical History:  Diagnosis Date   Abnormal Pap smear    Burning with urination 09/29/2015   Cancer ([facility])    squamous cell chest   Carpal tunnel syndrome, bilateral 09/29/2015   Diverticulitis    H/O hiatal hernia    HSV-1 (herpes simplex virus 1) infection    HSV-2 (herpes simplex virus 2) infection    Hyperlipidemia    Hypertension    Vaginal dryness 07/17/2014   Vaginal Pap smear, abnormal    Vitamin D deficiency 09/09/2015   Warts, genital     Patient Active Problem List   Diagnosis Date Noted   Encounter for screening fecal occult blood testing 01/18/2020   Essential hypertension 09/26/2019   History of herpes simplex infection 09/26/2019   History of trichomoniasis 09/26/2019   Abdominal pain, left lower quadrant 02/22/2018   Abdominal bloating 02/22/2018   Encounter for well woman exam with routine gynecological exam 12/20/2016   Screening cholesterol level 12/20/2016   Skin tag of vulva 12/20/2016   Family history of colon cancer 06/07/2016   Acute diverticulitis 02/26/2016    Class: History of   Abdominal pain 02/26/2016   Burning with  urination 09/29/2015   Carpal tunnel syndrome, bilateral 09/29/2015   Vitamin D deficiency 09/09/2015   Vaginal dryness 07/17/2014   Indigestion 07/17/2014   Squamous cell carcinoma of skin 12/15/2012   Warts, genital    HTN (hypertension) 07/11/2012   HSV-2 (herpes simplex virus 2) infection 07/11/2012   HEMOCCULT POSITIVE STOOL 09/16/2009   EPIGASTRIC PAIN 09/15/2009    Past Surgical History:  Procedure Laterality Date   CESAREAN SECTION     COLONOSCOPY N/A 07/19/2016   Dr. Oneida Alar: 6 mm polyp, benign, diverticulosis involving rectosigmoid colon and sigmoid colon, small internal hemorrhoids.  Due to family history of colon cancer in a first-degree relative at early age, plans for next colonoscopy 5 years.   COLONOSCOPY WITH ESOPHAGOGASTRODUODENOSCOPY (EGD)  2011   Dr. Oneida Alar: small hh, gastritis bx negative for h.pylori, diverticulosis of colon, two hyperplastic colon polyps removed. TCS in 5 years due to father having colon cancer at age 67   COLPOSCOPY W/ BIOPSY / CURETTAGE     EXPLORATORY LAPAROTOMY     MELANOMA EXCISION Right 12/25/2012   Procedure: wide EXCISION squamous cell carcinoma right chest ;  Surgeon: Zenovia Jarred, MD;  Location: [facility];  Service: General;  Laterality: Right;   MOLE REMOVAL     has had 30 + removed not cancer   TONSILLECTOMY     TUBAL LIGATION       OB History  Gravida  2   Para  2   Term  2   Preterm      AB      Living  2      SAB      IAB      Ectopic      Multiple      Live Births  2           Family History  Problem Relation Age of Onset   Cancer Father        colon and lung   COPD Father    Colon cancer Father 41   Atrial fibrillation Mother    Diabetes Mother    Hyperlipidemia Mother    Hypertension Mother    Stroke Sister    Heart attack Brother    Other Daughter        benign pituitary tumor   Ovarian cancer Maternal Aunt     Social History   Tobacco Use   Smoking status: Never   Smokeless  tobacco: Never  Vaping Use   Vaping Use: Never used  Substance Use Topics   Alcohol use: Yes    Comment: occ.   Drug use: No    Home Medications Prior to Admission medications   Medication Sig Start Date End Date Taking? Authorizing Provider  ciprofloxacin (CIPRO) 500 MG tablet Take 1 tablet (500 mg total) by mouth 2 (two) times daily. 08/27/20   Faustino Congress, NP  fluconazole (DIFLUCAN) 150 MG tablet Take one tablet at the onset of symptoms, if still having symptoms in 3 days, take the second tablet. 08/27/20   Faustino Congress, NP  losartan-hydrochlorothiazide [facility]) 50-12.5 MG tablet TAKE 1 TABLET BY MOUTH EVERY DAY 01/19/21   Estill Dooms, NP  metroNIDAZOLE (FLAGYL) 500 MG tablet Take 1 tablet (500 mg total) by mouth 2 (two) times daily. 08/27/20   Faustino Congress, NP  valACYclovir (VALTREX) 1000 MG tablet TAKE 1 TABLET (1,000 MG TOTAL) BY MOUTH DAILY AS NEEDED. 07/01/20   Estill Dooms, NP    Allergies    Erythromycin, Lisinopril, and Phenergan [promethazine hcl]  Review of Systems   Review of Systems  Constitutional:        Per HPI, otherwise negative  HENT:         Per HPI, otherwise negative  Respiratory:         Per HPI, otherwise negative  Cardiovascular:        Per HPI, otherwise negative  Gastrointestinal:  Positive for abdominal pain, constipation and diarrhea. Negative for vomiting.  Endocrine:       Negative aside from HPI  Genitourinary:        Neg aside from HPI   Musculoskeletal:        Per HPI, otherwise negative  Skin: Negative.   Neurological:  Negative for syncope.   Physical Exam Updated Vital Signs BP (!) 141/65   Pulse 65   Temp 98 F (36.7 C)   Resp 18   Ht 5\' 3"  (1.6 m)   Wt 67.1 kg   SpO2 100%   BMI 26.22 kg/m   Physical Exam Vitals and nursing note reviewed.  Constitutional:      General: She is not in acute distress.    Appearance: She is well-developed.  HENT:     Head: Normocephalic and atraumatic.   Eyes:     Conjunctiva/sclera: Conjunctivae normal.  Cardiovascular:     Rate and Rhythm: Normal rate and regular rhythm.  Pulmonary:  Effort: Pulmonary effort is normal. No respiratory distress.     Breath sounds: Normal breath sounds. No stridor.  Abdominal:     General: There is no distension.     Tenderness: There is no abdominal tenderness. There is no guarding.     Comments: Denies current pain  Skin:    General: Skin is warm and dry.  Neurological:     Mental Status: She is alert and oriented to person, place, and time.     Cranial Nerves: No cranial nerve deficit.    ED Results / Procedures / Treatments   Labs (all labs ordered are listed, but only abnormal results are displayed) Labs Reviewed  COMPREHENSIVE METABOLIC PANEL - Abnormal; Notable for the following components:      Result Value   Glucose, Bld 106 (*)    All other components within normal limits  URINALYSIS, ROUTINE W REFLEX MICROSCOPIC  CBC WITH DIFFERENTIAL/PLATELET  LIPASE, BLOOD    EKG None  Radiology No results found.  Procedures Procedures   Medications Ordered in ED Medications - No data to display  ED Course  I have reviewed the triage vital signs and the nursing notes.  Pertinent labs & imaging results that were available during my care of the patient were reviewed by me and considered in my medical decision making (see chart for details).  Labs reviewed, discussed, urinalysis reviewed and discussed as well.  No leukocytosis, no fever, UA without evidence for infection and she denies dysuria. Adult female with history of diverticulitis in the past presents with months of ongoing, waxing, waning pain left lower quadrant.  She is awake, alert, in no distress, has a nonperitoneal abdominal exam.  Given her description of symptoms consistent with prior diverticulitis as well as new bowel habit changes some suspicion for clinical recurrence.  With reassuring labs, vitals as above patient is  appropriate for close outpatient follow-up with GI to which she is amenable.  No early evidence for other acute new phenomena. MDM Rules/Calculators/A&P MDM Number of Diagnoses or Management Options Diverticulitis of colon: established, worsening Left lower quadrant abdominal pain: established, worsening   Amount and/or Complexity of Data Reviewed Clinical lab tests: ordered and reviewed Tests in the radiology section of CPT: ordered and reviewed Tests in the medicine section of CPT: reviewed and ordered Decide to obtain previous medical records or to obtain history from someone other than the patient: yes Review and summarize past medical records: yes Independent visualization of images, tracings, or specimens: yes  Risk of Complications, Morbidity, and/or Mortality Presenting problems: high Diagnostic procedures: high Management options: high  Critical Care Total time providing critical care: < 30 minutes  Patient Progress Patient progress: stable   Final Clinical Impression(s) / ED Diagnoses Final diagnoses:  Left lower quadrant abdominal pain    Rx / DC Orders ED Discharge Orders          Ordered    metroNIDAZOLE (FLAGYL) 500 MG tablet  2 times daily        02/25/21 0821    ciprofloxacin (CIPRO) 500 MG tablet  2 times daily        02/25/21 0821             Carmin Muskrat, MD 02/25/21 [phone]

## 2021-03-08 ENCOUNTER — Ambulatory Visit
Admission: EM | Admit: 2021-03-08 | Discharge: 2021-03-08 | Disposition: A | Discharge status: Home / Self Care | Payer: Self-pay | Attending: Urgent Care | Admitting: Urgent Care

## 2021-03-08 ENCOUNTER — Other Ambulatory Visit: Payer: Self-pay

## 2021-03-08 ENCOUNTER — Encounter: Payer: Self-pay | Admitting: Emergency Medicine

## 2021-03-08 DIAGNOSIS — Z8719 Personal history of other diseases of the digestive system: Secondary | ICD-10-CM | POA: Insufficient documentation

## 2021-03-08 DIAGNOSIS — R102 Pelvic and perineal pain: Secondary | ICD-10-CM | POA: Insufficient documentation

## 2021-03-08 LAB — POCT URINALYSIS DIP (MANUAL ENTRY)
Bilirubin, UA: NEGATIVE
Blood, UA: NEGATIVE
Glucose, UA: NEGATIVE mg/dL
Ketones, POC UA: NEGATIVE mg/dL
Leukocytes, UA: NEGATIVE
Nitrite, UA: NEGATIVE
Protein Ur, POC: NEGATIVE mg/dL
Spec Grav, UA: 1.025 (ref 1.010–1.025)
Urobilinogen, UA: 0.2 E.U./dL
pH, UA: 5.5 (ref 5.0–8.0)

## 2021-03-08 MED ORDER — IBUPROFEN 600 MG PO TABS: 600.0000 mg | ORAL_TABLET | Freq: Three times a day (TID) | ORAL | 0 refills | Status: DC | PRN

## 2021-03-08 NOTE — ED Triage Notes (Signed)
Seen in the ED on Tuesday and diagnosed with diverticulitis.  Was placed on cipro and flagyl.  Pain on left lower side that radiates to left lower back.  Would like to be STD tested.

## 2021-03-08 NOTE — ED Provider Notes (Signed)
[facility]   MRN: [id number] DOB: [birth year]  Subjective:   Shannon Lindsey is a 55 y.o. female presenting for 4 to 5-day history of acute onset persistent lower abdominal pain having lower pelvic pain worse over the left side.  Symptoms can sometimes radiate to the back.  Patient is sexually active but has not had sex in a while. Had an STI in 2021, tested positive for trichomoniasis.  Since then she has had intermittent infections with bacterial vaginosis.  Does not have any vaginal discharge sometimes normal for her.  No dysuria, urinary frequency, nausea, vomiting, bloody stools.  Patient did contact her regular doctor and advised her of her symptoms and was started on ciprofloxacin and Flagyl for empiric treatment of diverticulitis.  No CT scan was done.  No current facility-administered medications for this encounter.  Current Outpatient Medications:    ciprofloxacin (CIPRO) 500 MG tablet, Take 1 tablet (500 mg total) by mouth 2 (two) times daily., Disp: 14 tablet, Rfl: 0   fluconazole (DIFLUCAN) 150 MG tablet, Take one tablet at the onset of symptoms, if still having symptoms in 3 days, take the second tablet., Disp: 2 tablet, Rfl: 0   losartan-hydrochlorothiazide (HYZAAR) 50-12.5 MG tablet, TAKE 1 TABLET BY MOUTH EVERY DAY, Disp: 90 tablet, Rfl: 4   metroNIDAZOLE (FLAGYL) 500 MG tablet, Take 1 tablet (500 mg total) by mouth 2 (two) times daily., Disp: 14 tablet, Rfl: 0   valACYclovir (VALTREX) 1000 MG tablet, TAKE 1 TABLET (1,000 MG TOTAL) BY MOUTH DAILY AS NEEDED., Disp: 90 tablet, Rfl: 3   Allergies  Allergen Reactions   Erythromycin Other (See Comments)    Gets a UTI every time.   Lisinopril Other (See Comments)    cough   Phenergan [Promethazine Hcl]     25 m iv made her feel jittery and nauseated. TOLERATED 6.25 MG IV.    Past Medical History:  Diagnosis Date   Abnormal Pap smear    Burning with urination 09/29/2015   Cancer ([facility])    squamous cell chest    Carpal tunnel syndrome, bilateral 09/29/2015   Diverticulitis    H/O hiatal hernia    HSV-1 (herpes simplex virus 1) infection    HSV-2 (herpes simplex virus 2) infection    Hyperlipidemia    Hypertension    Vaginal dryness 07/17/2014   Vaginal Pap smear, abnormal    Vitamin D deficiency 09/09/2015   Warts, genital      Past Surgical History:  Procedure Laterality Date   CESAREAN SECTION     COLONOSCOPY N/A 07/19/2016   Dr. Oneida Alar: 6 mm polyp, benign, diverticulosis involving rectosigmoid colon and sigmoid colon, small internal hemorrhoids.  Due to family history of colon cancer in a first-degree relative at early age, plans for next colonoscopy 5 years.   COLONOSCOPY WITH ESOPHAGOGASTRODUODENOSCOPY (EGD)  2011   Dr. Oneida Alar: small hh, gastritis bx negative for h.pylori, diverticulosis of colon, two hyperplastic colon polyps removed. TCS in 5 years due to father having colon cancer at age 59   COLPOSCOPY W/ BIOPSY / CURETTAGE     EXPLORATORY LAPAROTOMY     MELANOMA EXCISION Right 12/25/2012   Procedure: wide EXCISION squamous cell carcinoma right chest ;  Surgeon: Zenovia Jarred, MD;  Location: [facility];  Service: General;  Laterality: Right;   MOLE REMOVAL     has had 30 + removed not cancer   TONSILLECTOMY     TUBAL LIGATION      Family History  Problem Relation Age of Onset   Cancer Father        colon and lung   COPD Father    Colon cancer Father 7   Atrial fibrillation Mother    Diabetes Mother    Hyperlipidemia Mother    Hypertension Mother    Stroke Sister    Heart attack Brother    Other Daughter        benign pituitary tumor   Ovarian cancer Maternal Aunt     Social History   Tobacco Use   Smoking status: Never   Smokeless tobacco: Never  Vaping Use   Vaping Use: Never used  Substance Use Topics   Alcohol use: Yes    Comment: occ.   Drug use: No    ROS   Objective:   Vitals: BP 133/78 (BP Location: Right Arm)   Pulse 81   Temp (!) 97.3 F (36.3  C) (Oral)   Resp 18   SpO2 97%   Physical Exam Constitutional:      General: She is not in acute distress.    Appearance: Normal appearance. She is well-developed. She is not ill-appearing, toxic-appearing or diaphoretic.  HENT:     Head: Normocephalic and atraumatic.     Nose: Nose normal.     Mouth/Throat:     Mouth: Mucous membranes are moist.     Pharynx: Oropharynx is clear.  Eyes:     General: No scleral icterus.       Right eye: No discharge.        Left eye: No discharge.     Extraocular Movements: Extraocular movements intact.     Conjunctiva/sclera: Conjunctivae normal.     Pupils: Pupils are equal, round, and reactive to light.  Cardiovascular:     Rate and Rhythm: Normal rate.  Pulmonary:     Effort: Pulmonary effort is normal.  Abdominal:     General: Bowel sounds are normal. There is no distension.     Palpations: Abdomen is soft. There is no mass.     Tenderness: There is abdominal tenderness (mild over lower abdomen/pelvic). There is no right CVA tenderness, left CVA tenderness, guarding or rebound.  Skin:    General: Skin is warm and dry.  Neurological:     General: No focal deficit present.     Mental Status: She is alert and oriented to person, place, and time.  Psychiatric:        Mood and Affect: Mood normal.        Behavior: Behavior normal.        Thought Content: Thought content normal.        Judgment: Judgment normal.    Results for orders placed or performed during the hospital encounter of 03/08/21 (from the past 24 hour(s))  POCT urinalysis dipstick     Status: None   Collection Time: 03/08/21  9:16 AM  Result Value Ref Range   Color, UA yellow yellow   Clarity, UA clear clear   Glucose, UA negative negative mg/dL   Bilirubin, UA negative negative   Ketones, POC UA negative negative mg/dL   Spec Grav, UA 1.025 1.010 - 1.025   Blood, UA negative negative   pH, UA 5.5 5.0 - 8.0   Protein Ur, POC negative negative mg/dL   Urobilinogen,  UA 0.2 0.2 or 1.0 E.U./dL   Nitrite, UA Negative Negative   Leukocytes, UA Negative Negative    Assessment and Plan :   PDMP not reviewed this  encounter.  1. Pelvic pain in female   2. History of diverticulitis of colon    Low suspicion for acute abdomen, PID. STI, BV and yeast infection check pending. Will treat as appropriate. Use ibuprofen as needed for pain. Continue current regimen prescribed. Follow up with PCP. Counseled patient on potential for adverse effects with medications prescribed/recommended today, ER and return-to-clinic precautions discussed, patient verbalized understanding.    Jaynee Eagles, [place] 03/08/21 [phone]

## 2021-03-09 LAB — CERVICOVAGINAL ANCILLARY ONLY
Bacterial Vaginitis (gardnerella): POSITIVE — AB
Candida Glabrata: NEGATIVE
Candida Vaginitis: NEGATIVE
Chlamydia: NEGATIVE
Comment: NEGATIVE
Comment: NEGATIVE
Comment: NEGATIVE
Comment: NEGATIVE
Comment: NEGATIVE
Comment: NORMAL
Neisseria Gonorrhea: NEGATIVE
Trichomonas: NEGATIVE

## 2021-03-10 ENCOUNTER — Telehealth: Payer: Self-pay

## 2021-03-10 ENCOUNTER — Telehealth (HOSPITAL_COMMUNITY): Payer: Self-pay | Admitting: Emergency Medicine

## 2021-03-10 MED ORDER — METRONIDAZOLE 500 MG PO TABS
500.0000 mg | ORAL_TABLET | Freq: Two times a day (BID) | ORAL | 0 refills | Status: DC
Start: 2021-03-10 — End: 2021-03-10

## 2021-03-10 MED ORDER — METRONIDAZOLE 500 MG PO TABS
500.0000 mg | ORAL_TABLET | Freq: Two times a day (BID) | ORAL | 0 refills | Status: DC
Start: 2021-03-10 — End: 2021-05-22

## 2021-03-10 MED ORDER — METRONIDAZOLE 500 MG PO TABS: 500.0000 mg | ORAL_TABLET | Freq: Two times a day (BID) | ORAL | 0 refills | Status: DC

## 2021-03-12 ENCOUNTER — Emergency Department (HOSPITAL_COMMUNITY)
Admission: EM | Admit: 2021-03-12 | Discharge: 2021-03-12 | Disposition: A | Discharge status: Home / Self Care | Payer: Self-pay | Attending: Emergency Medicine | Admitting: Emergency Medicine

## 2021-03-12 ENCOUNTER — Emergency Department (HOSPITAL_COMMUNITY): Payer: Self-pay

## 2021-03-12 ENCOUNTER — Other Ambulatory Visit: Payer: Self-pay

## 2021-03-12 ENCOUNTER — Encounter (HOSPITAL_COMMUNITY): Payer: Self-pay | Admitting: *Deleted

## 2021-03-12 DIAGNOSIS — K5732 Diverticulitis of large intestine without perforation or abscess without bleeding: Secondary | ICD-10-CM

## 2021-03-12 DIAGNOSIS — I10 Essential (primary) hypertension: Secondary | ICD-10-CM | POA: Insufficient documentation

## 2021-03-12 DIAGNOSIS — Z85828 Personal history of other malignant neoplasm of skin: Secondary | ICD-10-CM | POA: Insufficient documentation

## 2021-03-12 DIAGNOSIS — Z79899 Other long term (current) drug therapy: Secondary | ICD-10-CM | POA: Insufficient documentation

## 2021-03-12 LAB — URINALYSIS, ROUTINE W REFLEX MICROSCOPIC
Bilirubin Urine: NEGATIVE
Glucose, UA: NEGATIVE mg/dL
Hgb urine dipstick: NEGATIVE
Ketones, ur: NEGATIVE mg/dL
Leukocytes,Ua: NEGATIVE
Nitrite: NEGATIVE
Protein, ur: NEGATIVE mg/dL
Specific Gravity, Urine: 1.015 (ref 1.005–1.030)
pH: 6 (ref 5.0–8.0)

## 2021-03-12 LAB — COMPREHENSIVE METABOLIC PANEL
ALT: 17 U/L (ref 0–44)
AST: 19 U/L (ref 15–41)
Albumin: 4.1 g/dL (ref 3.5–5.0)
Alkaline Phosphatase: 44 U/L (ref 38–126)
Anion gap: 11 (ref 5–15)
BUN: 15 mg/dL (ref 6–20)
CO2: 24 mmol/L (ref 22–32)
Calcium: 8.8 mg/dL — ABNORMAL LOW (ref 8.9–10.3)
Chloride: 104 mmol/L (ref 98–111)
Creatinine, Ser: 0.71 mg/dL (ref 0.44–1.00)
GFR, Estimated: >60 mL/min (ref >60)
Glucose, Bld: 96 mg/dL (ref 70–99)
Potassium: 3.7 mmol/L (ref 3.5–5.1)
Sodium: 139 mmol/L (ref 135–145)
Total Bilirubin: 0.4 mg/dL (ref 0.3–1.2)
Total Protein: 6.8 g/dL (ref 6.5–8.1)

## 2021-03-12 LAB — CBC
HCT: 44.5 % (ref 36.0–46.0)
Hemoglobin: 14.6 g/dL (ref 12.0–15.0)
MCH: 29.9 pg (ref 26.0–34.0)
MCHC: 32.8 g/dL (ref 30.0–36.0)
MCV: 91.2 fL (ref 80.0–100.0)
Platelets: 290 10*3/uL (ref 150–400)
RBC: 4.88 MIL/uL (ref 3.87–5.11)
RDW: 12.9 % (ref 11.5–15.5)
WBC: 5.3 10*3/uL (ref 4.0–10.5)
nRBC: 0 % (ref 0.0–0.2)

## 2021-03-12 LAB — LIPASE, BLOOD: Lipase: 30 U/L (ref 11–51)

## 2021-03-12 MED ORDER — IOHEXOL 300 MG/ML  SOLN
100.0000 mL | Freq: Once | INTRAMUSCULAR | Status: AC | PRN
  Administered 2021-03-12: 100 mL via INTRAVENOUS

## 2021-03-12 MED ORDER — AMOXICILLIN-POT CLAVULANATE 875-125 MG PO TABS: 1.0000 | ORAL_TABLET | Freq: Two times a day (BID) | ORAL | 0 refills | Status: AC

## 2021-03-12 NOTE — Discharge Instructions (Addendum)
Get help right away if: Your pain gets worse. Your symptoms do not get better with treatment. Your symptoms suddenly get worse. You have a fever. You vomit more than one time. You have stools that are bloody, black, or tarry.

## 2021-03-12 NOTE — ED Triage Notes (Signed)
Pt c/o left flank pain and left sided abdominal pain x 3 weeks. Pt reports she has been treated for diverticulitis, but the pain isn't getting any better. She has been on antibiotics without relief in pain. She reports her urine and blood work has been checked, but she hasn't had any imaging studies. She reports pain is slightly relived after she has a BM in the morning. She reports the pain initially started in her LLQ, then moved to her LLQ, then to her left flank area.

## 2021-03-12 NOTE — ED Provider Notes (Signed)
[facility] EMERGENCY DEPARTMENT Provider Note   CSN: [id number] Arrival date & time: 03/12/21  8546     History Chief Complaint  Patient presents with   Flank Pain   Abdominal Pain    Shannon Lindsey is a 55 y.o. female who presents emergency department chief complaint of left sided abdominal pain and flank pain.  She had onset of these symptoms beginning over the past few months which has been intermittent but has been more constant over the past 3 weeks.  She was seen on 02/25/2021 with assumed diverticulitis and started on Cipro and Flagyl.  She states that her symptoms did not improve she was seen on 1211 at the urgent care and diagnosed with bacterial vaginosis and treated for BV.  The patient returns today because she feels no better.  She complains of dull, nagging, minimally painful left upper and lower quadrant abdominal pain that radiates around the left flank.  She states that it is worse when she lays on that side and worse at night when she is still.  She states that it is relieved when she defecates in the morning.  She denies fevers, chills, diarrhea, constipation, urinary symptoms.  She is concerned that she may need imaging.  She declines pain medication and states that her pain is not severe enough for this.  Flank Pain Associated symptoms include abdominal pain.  Abdominal Pain     Past Medical History:  Diagnosis Date   Abnormal Pap smear    Burning with urination 09/29/2015   Cancer ([place])    squamous cell chest   Carpal tunnel syndrome, bilateral 09/29/2015   Diverticulitis    H/O hiatal hernia    HSV-1 (herpes simplex virus 1) infection    HSV-2 (herpes simplex virus 2) infection    Hyperlipidemia    Hypertension    Vaginal dryness 07/17/2014   Vaginal Pap smear, abnormal    Vitamin D deficiency 09/09/2015   Warts, genital     Patient Active Problem List   Diagnosis Date Noted   Encounter for screening fecal occult blood testing 01/18/2020   Essential  hypertension 09/26/2019   History of herpes simplex infection 09/26/2019   History of trichomoniasis 09/26/2019   Abdominal pain, left lower quadrant 02/22/2018   Abdominal bloating 02/22/2018   Encounter for well woman exam with routine gynecological exam 12/20/2016   Screening cholesterol level 12/20/2016   Skin tag of vulva 12/20/2016   Family history of colon cancer 06/07/2016   Acute diverticulitis 02/26/2016    Class: History of   Abdominal pain 02/26/2016   Burning with urination 09/29/2015   Carpal tunnel syndrome, bilateral 09/29/2015   Vitamin D deficiency 09/09/2015   Vaginal dryness 07/17/2014   Indigestion 07/17/2014   Squamous cell carcinoma of skin 12/15/2012   Warts, genital    HTN (hypertension) 07/11/2012   HSV-2 (herpes simplex virus 2) infection 07/11/2012   HEMOCCULT POSITIVE STOOL 09/16/2009   EPIGASTRIC PAIN 09/15/2009    Past Surgical History:  Procedure Laterality Date   CESAREAN SECTION     COLONOSCOPY N/A 07/19/2016   Dr. Oneida Alar: 6 mm polyp, benign, diverticulosis involving rectosigmoid colon and sigmoid colon, small internal hemorrhoids.  Due to family history of colon cancer in a first-degree relative at early age, plans for next colonoscopy 5 years.   COLONOSCOPY WITH ESOPHAGOGASTRODUODENOSCOPY (EGD)  2011   Dr. Oneida Alar: small hh, gastritis bx negative for h.pylori, diverticulosis of colon, two hyperplastic colon polyps removed. TCS in 5 years due  to father having colon cancer at age 36   COLPOSCOPY W/ BIOPSY / [place] Right 12/25/2012   Procedure: wide EXCISION squamous cell carcinoma right chest ;  Surgeon: Zenovia Jarred, MD;  Location: [place];  Service: General;  Laterality: Right;   MOLE REMOVAL     has had 107 + removed not cancer   TONSILLECTOMY     TUBAL LIGATION       OB History     Gravida  2   Para  2   Term  2   Preterm      AB      Living  2      SAB      IAB       Ectopic      Multiple      Live Births  2           Family History  Problem Relation Age of Onset   Cancer Father        colon and lung   COPD Father    Colon cancer Father 90   Atrial fibrillation Mother    Diabetes Mother    Hyperlipidemia Mother    Hypertension Mother    Stroke Sister    Heart attack Brother    Other Daughter        benign pituitary tumor   Ovarian cancer Maternal Aunt     Social History   Tobacco Use   Smoking status: Never   Smokeless tobacco: Never  Vaping Use   Vaping Use: Never used  Substance Use Topics   Alcohol use: Yes    Comment: occ.   Drug use: No    Home Medications Prior to Admission medications   Medication Sig Start Date End Date Taking? Authorizing Provider  ciprofloxacin (CIPRO) 500 MG tablet Take 1 tablet (500 mg total) by mouth 2 (two) times daily. 02/25/21   Carmin Muskrat, MD  fluconazole (DIFLUCAN) 150 MG tablet Take one tablet at the onset of symptoms, if still having symptoms in 3 days, take the second tablet. 08/27/20   Faustino Congress, NP  ibuprofen (ADVIL) 600 MG tablet Take 1 tablet (600 mg total) by mouth every 8 (eight) hours as needed. 03/08/21   Jaynee Eagles, PA-C  losartan-hydrochlorothiazide [facility]) 50-12.5 MG tablet TAKE 1 TABLET BY MOUTH EVERY DAY 01/19/21   Estill Dooms, NP  metroNIDAZOLE (FLAGYL) 500 MG tablet Take 1 tablet (500 mg total) by mouth 2 (two) times daily. 03/10/21   Volney American, PA-C  valACYclovir (VALTREX) 1000 MG tablet TAKE 1 TABLET (1,000 MG TOTAL) BY MOUTH DAILY AS NEEDED. 07/01/20   Estill Dooms, NP    Allergies    Erythromycin, Lisinopril, and Phenergan [promethazine hcl]  Review of Systems   Review of Systems  Gastrointestinal:  Positive for abdominal pain.  Genitourinary:  Positive for flank pain.  Ten systems reviewed and are negative for acute change, except as noted in the HPI.   Physical Exam Updated Vital Signs BP (!) 146/82 (BP Location:  Right Arm)    Pulse 65    Temp 97.9 F (36.6 C) (Oral)    Resp 16    Ht 5\' 3"  (1.6 m)    Wt 67.1 kg    SpO2 100%    BMI 26.22 kg/m   Physical Exam Vitals and nursing note reviewed.  Constitutional:      General: She is not  in acute distress.    Appearance: She is well-developed. She is not diaphoretic.  HENT:     Head: Normocephalic and atraumatic.     Right Ear: External ear normal.     Left Ear: External ear normal.     Nose: Nose normal.     Mouth/Throat:     Mouth: Mucous membranes are moist.  Eyes:     General: No scleral icterus.    Conjunctiva/sclera: Conjunctivae normal.  Cardiovascular:     Rate and Rhythm: Normal rate and regular rhythm.     Heart sounds: Normal heart sounds. No murmur heard.   No friction rub. No gallop.  Pulmonary:     Effort: Pulmonary effort is normal. No respiratory distress.     Breath sounds: Normal breath sounds.  Abdominal:     General: Bowel sounds are normal. There is no distension.     Palpations: Abdomen is soft. There is no mass.     Tenderness: There is abdominal tenderness (minimally tender to palpation) in the left upper quadrant and left lower quadrant. There is no right CVA tenderness, left CVA tenderness or guarding.  Musculoskeletal:     Cervical back: Normal range of motion.  Skin:    General: Skin is warm and dry.  Neurological:     Mental Status: She is alert and oriented to person, place, and time.  Psychiatric:        Behavior: Behavior normal.    ED Results / Procedures / Treatments   Labs (all labs ordered are listed, but only abnormal results are displayed) Labs Reviewed  URINALYSIS, ROUTINE W REFLEX MICROSCOPIC  LIPASE, BLOOD  COMPREHENSIVE METABOLIC PANEL  CBC    EKG None  Radiology No results found.  Procedures Procedures   Medications Ordered in ED Medications - No data to display  ED Course  I have reviewed the triage vital signs and the nursing notes.  Pertinent labs & imaging results that  were available during my care of the patient were reviewed by me and considered in my medical decision making (see chart for details).    MDM Rules/Calculators/A&P                         Patient here with steady left upper and lower abdominal quadrant pain. The differential diagnosis for generalized abdominal pain includes, but is not limited to AAA, gastroenteritis, appendicitis, Bowel obstruction, Bowel perforation. Gastroparesis, DKA, Hernia, Inflammatory bowel disease, mesenteric ischemia, pancreatitis, peritonitis SBP, diverticulitis, volvulus. I ordered and reviewed labs that included CBC lipase and urine within normal limits, CMP with very mild hypocalcemia of insignificant value.  I ordered and reviewed a CT scan of the abdomen which shows acute but minimal diverticulitis without evidence of perforation or abscess.  Patient had completed a course of Cipro and Flagyl.  I will switch her to Augmentin for 10 days.  I discussed also implementing a clear liquid diet for the next several days.  Patient is very well-appearing.  She does not have an elevated white blood cell count.  No evidence of acute abdomen, she is not peritoneal headache and there are no signs that the patient needs to be hospitalized at this point.  Given her recent bout of appendicitis was treatment is Cipro and Flagyl I have discussed that if she fails that she may need admission for higher level antibiotics however at this time we both agree that that is not the case.  Patient appears appropriate for  outpatient follow-up.  I discussed return precautions.   Final Clinical Impression(s) / ED Diagnoses Final diagnoses:  None    Rx / DC Orders ED Discharge Orders     None        Margarita Mail, PA-C 03/12/21 1612    Milton Ferguson, MD 03/13/21 2336

## 2021-05-22 ENCOUNTER — Other Ambulatory Visit: Payer: Self-pay

## 2021-05-22 ENCOUNTER — Other Ambulatory Visit (HOSPITAL_COMMUNITY)
Admission: RE | Admit: 2021-05-22 | Discharge: 2021-05-22 | Disposition: A | Discharge status: Home / Self Care | Payer: 59 | Source: Ambulatory Visit | Attending: Adult Health | Admitting: Adult Health

## 2021-05-22 ENCOUNTER — Ambulatory Visit (INDEPENDENT_AMBULATORY_CARE_PROVIDER_SITE_OTHER): Payer: 59 | Admitting: Adult Health

## 2021-05-22 ENCOUNTER — Encounter: Payer: Self-pay | Admitting: Adult Health

## 2021-05-22 VITALS — BP 119/76 | HR 74 | Ht 62.25 in | Wt 146.0 lb

## 2021-05-22 DIAGNOSIS — Z01419 Encounter for gynecological examination (general) (routine) without abnormal findings: Secondary | ICD-10-CM | POA: Diagnosis not present

## 2021-05-22 DIAGNOSIS — Z13220 Encounter for screening for lipoid disorders: Secondary | ICD-10-CM

## 2021-05-22 DIAGNOSIS — I10 Essential (primary) hypertension: Secondary | ICD-10-CM

## 2021-05-22 DIAGNOSIS — F419 Anxiety disorder, unspecified: Secondary | ICD-10-CM | POA: Diagnosis not present

## 2021-05-22 DIAGNOSIS — Z1329 Encounter for screening for other suspected endocrine disorder: Secondary | ICD-10-CM

## 2021-05-22 DIAGNOSIS — Z1211 Encounter for screening for malignant neoplasm of colon: Secondary | ICD-10-CM

## 2021-05-22 DIAGNOSIS — Z780 Asymptomatic menopausal state: Secondary | ICD-10-CM

## 2021-05-22 DIAGNOSIS — R69 Illness, unspecified: Secondary | ICD-10-CM | POA: Diagnosis not present

## 2021-05-22 LAB — HEMOCCULT GUIAC POC 1CARD (OFFICE): Fecal Occult Blood, POC: NEGATIVE

## 2021-05-22 MED ORDER — BUSPIRONE HCL 5 MG PO TABS: 5.0000 mg | ORAL_TABLET | Freq: Three times a day (TID) | ORAL | 3 refills | Status: DC

## 2021-05-22 MED ORDER — VALACYCLOVIR HCL 1 G PO TABS: 1000.0000 mg | ORAL_TABLET | Freq: Every day | ORAL | 3 refills | Status: DC | PRN

## 2021-05-22 NOTE — Progress Notes (Signed)
Patient ID: Shannon Lindsey, female   DOB: 1965/08/15, 56 y.o.   MRN: [id number] History of Present Illness: Shannon Lindsey is a 56 year old white female,separated, PM in for a well woman gyn eam and pap. PCP is Dr Karie Kirks.   Current Medications, Allergies, Past Medical History, Past Surgical History, Family History and Social History were reviewed in [employer] record.     Review of Systems: Patient denies any headaches, hearing loss, fatigue, blurred vision, shortness of breath, chest pain, abdominal pain, problems with bowel movements, urination, or intercourse. No joint pain or mood swings.  Has more anxiety, parents aging,has disabled brother and sister, not much help   Physical Exam:BP 119/76 (BP Location: Left Arm, Patient Position: Sitting, Cuff Size: Normal)    Pulse 74    Ht 5' 2.25" (1.581 m)    Wt 146 lb (66.2 kg)    BMI 26.49 kg/m   General:  Well developed, well nourished, no acute distress Skin:  Warm and dry Neck:  Midline trachea, normal thyroid, good ROM, no lymphadenopathy Lungs; Clear to auscultation bilaterally Breast:  No dominant palpable mass, retraction, or nipple discharge Cardiovascular: Regular rate and rhythm Abdomen:  Soft, non tender, no hepatosplenomegaly Pelvic:  External genitalia is normal in appearance, no lesions.  The vagina is pale with loss of rugae and moisture. Urethra has no lesions or masses. The cervix is bulbous.  Uterus is felt to be normal size, shape, and contour.  No adnexal masses or tenderness noted.Bladder is non tender, no masses felt. Rectal: Good sphincter tone, no polyps, or hemorrhoids felt.  Hemoccult negative. Extremities/musculoskeletal:  No swelling or varicosities noted, no clubbing or cyanosis Psych:  No mood changes, alert and cooperative,seems happy AA is 2 Fall risk is low Depression screen [facility] 2/9 05/22/2021 01/18/2020 09/26/2019  Decreased Interest 1 0 0  Down, Depressed, Hopeless 1 0 0  PHQ - 2 Score 2 0  0  Altered sleeping 2 1 0  Tired, decreased energy 2 0 0  Change in appetite 2 0 0  Feeling bad or failure about yourself  0 0 0  Trouble concentrating 0 0 0  Moving slowly or fidgety/restless 0 0 0  Suicidal thoughts 0 0 0  PHQ-9 Score 8 1 0    GAD 7 : Generalized Anxiety Score 05/22/2021 01/18/2020  Nervous, Anxious, on Edge 2 1  Control/stop worrying 3 1  Worry too much - different things 3 1  Trouble relaxing 2 1  Restless 2 0  Easily annoyed or irritable 1 0  Afraid - awful might happen 2 1  Total GAD 7 Score 15 5    Upstream - 05/22/21 0837       Pregnancy Intention Screening   Does the patient want to become pregnant in the next year? No    Does the patient's partner want to become pregnant in the next year? No    Would the patient like to discuss contraceptive options today? No      Contraception Wrap Up   Current Method Female Sterilization    End Method Female Sterilization    Contraception Counseling Provided No            Examination chaperoned by Levy Pupa LPN    Impression and Plan: 1. Encounter for gynecological examination with Papanicolaou smear of cervix Pap sent Pap in 3 years if normal Physical in 1 year Will check labs Mammogram yearly Colonoscopy due this year Will refill valtrex  - Cytology -  PAP( [place]) - CBC - Comprehensive metabolic panel - TSH - Lipid panel  2. Encounter for screening fecal occult blood testing - POCT occult blood stool  3. Postmenopausal  4. Anxiety Will try Buspar, let me know in about 2-3 months how it is working or not  Meds ordered this encounter  Medications   valACYclovir (VALTREX) 1000 MG tablet    Sig: Take 1 tablet (1,000 mg total) by mouth daily as needed.    Dispense:  90 tablet    Refill:  3    Order Specific Question:   Supervising Provider    Answer:   Elonda Husky, LUTHER H [[id number]]   busPIRone (BUSPAR) 5 MG tablet    Sig: Take 1 tablet (5 mg total) by mouth 3 (three) times daily.     Dispense:  90 tablet    Refill:  3    Order Specific Question:   Supervising Provider    Answer:   Elonda Husky, LUTHER H [[id number]]     5. Essential hypertension Continue Hyzaar 50-12.5 mg 1 daily has refills  - Comprehensive metabolic panel  6. Screening cholesterol level - Lipid panel  7. Screening for thyroid disorder - TSH

## 2021-05-23 LAB — COMPREHENSIVE METABOLIC PANEL
ALT: 14 IU/L (ref 0–32)
AST: 16 IU/L (ref 0–40)
Albumin/Globulin Ratio: 2 (ref 1.2–2.2)
Albumin: 4.7 g/dL (ref 3.8–4.9)
Alkaline Phosphatase: 68 IU/L (ref 44–121)
BUN/Creatinine Ratio: 18 (ref 9–23)
BUN: 15 mg/dL (ref 6–24)
Bilirubin Total: 0.4 mg/dL (ref 0.0–1.2)
CO2: 23 mmol/L (ref 20–29)
Calcium: 9.1 mg/dL (ref 8.7–10.2)
Chloride: 100 mmol/L (ref 96–106)
Creatinine, Ser: 0.83 mg/dL (ref 0.57–1.00)
Globulin, Total: 2.3 g/dL (ref 1.5–4.5)
Glucose: 91 mg/dL (ref 70–99)
Potassium: 4.5 mmol/L (ref 3.5–5.2)
Sodium: 138 mmol/L (ref 134–144)
Total Protein: 7 g/dL (ref 6.0–8.5)
eGFR: 82 mL/min/{1.73_m2} (ref >59)

## 2021-05-23 LAB — CBC
Hematocrit: 46.9 % — ABNORMAL HIGH (ref 34.0–46.6)
Hemoglobin: 15.4 g/dL (ref 11.1–15.9)
MCH: 29.1 pg (ref 26.6–33.0)
MCHC: 32.8 g/dL (ref 31.5–35.7)
MCV: 89 fL (ref 79–97)
Platelets: 288 10*3/uL (ref 150–450)
RBC: 5.3 x10E6/uL — ABNORMAL HIGH (ref 3.77–5.28)
RDW: 12.9 % (ref 11.7–15.4)
WBC: 4 10*3/uL (ref 3.4–10.8)

## 2021-05-23 LAB — LIPID PANEL
Chol/HDL Ratio: 3.6 ratio (ref 0.0–4.4)
Cholesterol, Total: 247 mg/dL — ABNORMAL HIGH (ref 100–199)
HDL: 68 mg/dL (ref >39)
LDL Chol Calc (NIH): 167 mg/dL — ABNORMAL HIGH (ref 0–99)
Triglycerides: 74 mg/dL (ref 0–149)
VLDL Cholesterol Cal: 12 mg/dL (ref 5–40)

## 2021-05-23 LAB — TSH: TSH: 1.24 u[IU]/mL (ref 0.450–4.500)

## 2021-05-26 LAB — CYTOLOGY - PAP
Adequacy: ABSENT
Comment: NEGATIVE
Diagnosis: NEGATIVE
High risk HPV: NEGATIVE

## 2021-05-27 ENCOUNTER — Telehealth: Payer: Self-pay | Admitting: Adult Health

## 2021-05-27 NOTE — Telephone Encounter (Signed)
Left message about labs and pap ?

## 2021-07-02 ENCOUNTER — Ambulatory Visit: Payer: Self-pay

## 2021-07-02 ENCOUNTER — Ambulatory Visit: Payer: 59 | Admitting: Sports Medicine

## 2021-07-02 VITALS — BP 126/84 | Ht 63.0 in | Wt 148.0 lb

## 2021-07-02 DIAGNOSIS — M79672 Pain in left foot: Secondary | ICD-10-CM | POA: Diagnosis not present

## 2021-07-02 DIAGNOSIS — M79671 Pain in right foot: Secondary | ICD-10-CM

## 2021-07-02 DIAGNOSIS — M722 Plantar fascial fibromatosis: Secondary | ICD-10-CM | POA: Diagnosis not present

## 2021-07-02 NOTE — Progress Notes (Signed)
PCP: Lemmie Evens, MD ? ?Subjective:  ? ?HPI: ?Ms. Shannon Lindsey is a 56 year old female who presents to the sports medicine clinic today with concerns for bilateral feet "bumps." Patient states that her feet both have been present for a "long time" and are generally not painful, and are located in the middle of the bottom of her feet she does experience minor discomfort when she is standing for extended periods of time, typically over 8+ hours. This is most common when she is at work where she has worked at an [employer] for the past 33 years.  Initially, she does enjoy wearing high heels and boots with heels, but this is also aggravating her discomfort and she has not worn the shoes since last winter. When asked to describe the pain she states, "it is not really painful at all, typically. It is more of a general discomfort."  She has tried nothing for alleviation. She states that its been present longer on the right foot that it has on the left, but she does note that it feels like the left bump is larger than the right.  One of her friends recommended this which clinic for her to be further evaluated. ? ?She does rotate through several pairs of tennis shoes, that she does change yearly.  She does not dissipate in high impact sports. She does not run for exercise, but does walk. ? ?Past Medical History:  ?Diagnosis Date  ? Abnormal Pap smear   ? Burning with urination 09/29/2015  ? Cancer [facility])   ? squamous cell chest  ? Carpal tunnel syndrome, bilateral 09/29/2015  ? Diverticulitis   ? H/O hiatal hernia   ? HSV-1 (herpes simplex virus 1) infection   ? HSV-2 (herpes simplex virus 2) infection   ? Hyperlipidemia   ? Hypertension   ? Vaginal dryness 07/17/2014  ? Vaginal Pap smear, abnormal   ? Vitamin D deficiency 09/09/2015  ? Warts, genital   ? ? ?Current Outpatient Medications on File Prior to Visit  ?Medication Sig Dispense Refill  ? busPIRone (BUSPAR) 5 MG tablet Take 1 tablet (5 mg total) by mouth 3 (three)  times daily. 90 tablet 3  ? losartan-hydrochlorothiazide (HYZAAR) 50-12.5 MG tablet TAKE 1 TABLET BY MOUTH EVERY DAY 90 tablet 4  ? valACYclovir (VALTREX) 1000 MG tablet Take 1 tablet (1,000 mg total) by mouth daily as needed. 90 tablet 3  ? ?No current facility-administered medications on file prior to visit.  ? ? ?Past Surgical History:  ?Procedure Laterality Date  ? CESAREAN SECTION    ? COLONOSCOPY N/A 07/19/2016  ? Dr. Oneida Alar: 6 mm polyp, benign, diverticulosis involving rectosigmoid colon and sigmoid colon, small internal hemorrhoids.  Due to family history of colon cancer in a first-degree relative at early age, plans for next colonoscopy 5 years.  ? COLONOSCOPY WITH ESOPHAGOGASTRODUODENOSCOPY (EGD)  2011  ? Dr. Oneida Alar: small hh, gastritis bx negative for h.pylori, diverticulosis of colon, two hyperplastic colon polyps removed. TCS in 5 years due to father having colon cancer at age 75  ? COLPOSCOPY W/ BIOPSY / [place]    ? EXPLORATORY LAPAROTOMY    ? [place] EXCISION Right 12/25/2012  ? Procedure: wide EXCISION squamous cell carcinoma right chest ;  Surgeon: Zenovia Jarred, MD;  Location: [place];  Service: General;  Laterality: Right;  ? MOLE REMOVAL    ? has had 30 + removed not cancer  ? TONSILLECTOMY    ? TUBAL LIGATION    ? ? ?  Allergies  ?Allergen Reactions  ? Erythromycin Other (See Comments)  ?  Gets a UTI every time.  ? Lisinopril Other (See Comments)  ?  cough  ? Phenergan [Promethazine Hcl]   ?  25 m iv made her feel jittery and nauseated. TOLERATED 6.25 MG IV.  ? ? ?BP 126/84   Ht '5\' 3"'$  (1.6 m)   Wt 148 lb (67.1 kg)   BMI 26.22 kg/m?  ? ? ?  07/02/2021  ?  9:00 AM  ?[place] Adult Exercise  ?Frequency of aerobic exercise (# of days/week) 2  ?Average time in minutes 60  ?Frequency of strengthening activities (# of days/week) 0  ? ? ?   ? View : No data to display.  ?  ?  ?  ? ? ?    ?Objective:  ?Physical Exam: ? ?Physical Exam ?Constitutional:   ?   Appearance: Normal appearance.   ?Musculoskeletal:  ?   Comments: Bilateral Foot: ?Inspection:  No obvious bony deformity b/l.  No swelling, erythema, or bruising b/l.  Normal arch b/l ?Palpation: smooth nodules palpated on the midfoot bilaterally with left greater than right. Non tender to palpation.  ?ROM: Full  ROM of the ankle b/l. Normal midfoot flexibility b/l ?Strength: 5/5 strength ankle in all planes b/l ?Neurovascular: N/V intact distally in the lower extremity b/l  ?Neurological:  ?   Mental Status: She is alert.  ?Psychiatric:     ?   Mood and Affect: Mood normal.     ?   Behavior: Behavior normal.  ?  ?  ?Assessment & Plan:  ?1. Plantar Fascial Fibromatosis of Both Feet: ?Patient presents with bilateral midfoot nodules that are present for "some time."   ? ?Ultrasound of plantar feet ?On further evaluation with ultrasound, a hypoechoic 0.48 x 1.44 cm nodule was noted in the left midfoot in the substance of the plantar fasci ?A 0.41 cm x 1.18 cm hypoechoic nodule was noted in the right plantar midfoot associated with plantar fascia ?Normal width 0.3 cm at insertion to medial calcaneus ? ? Impression: Findings consistent with plantar fascial fibromatosis of both feet without active plantar fasciitis ? ?Ultrasound and interpretation by Wolfgang Phoenix. Fields, MD ? ? ?This disease does have strong genetic disposition, commonly seen in [place], but patient is unaware of her heritage.  Currently, she is not feeling any pain, so we will not pursue any interventions at this time, including orthotics.  We discussed that there is a small chance of possible malignancy, and if the greatest to continue to grow patient should reach out for further evaluation.  She is agreeable at this time. ?- Patient to self monitor bilateral fibromatosis. ?- Patient to call if vitals become painful or if they continue to grow. ?- No intervention at this time ?

## 2021-07-02 NOTE — Assessment & Plan Note (Signed)
Patient presents with bilateral midfoot nodules that are present for "some time."  On further evaluation with ultrasound, a hypoechoic 0.48 x 1.44 cm nodule was noted in the left midfoot, and a 0.41 cm x 1.18 cm hypoechoic nodule was noted in the right midfoot.  Findings consistent with plantar fascial fibromatosis of both feet.  This disease does have strong genetic disposition, commonly seen in [place], but patient is unaware of her heritage.  Currently, she is not feeling any pain, so we will not pursue any interventions at this time, including orthotics.  We discussed that there is a small chance of possible malignancy, and if the greatest to continue to grow patient should reach out for further evaluation.  She is agreeable at this time. ?- Patient to self monitor bilateral fibromatosis. ?- Patient to call if vitals become painful or if they continue to grow. ?- No intervention at this time ?

## 2021-07-14 ENCOUNTER — Encounter: Payer: Self-pay | Admitting: *Deleted

## 2021-09-04 ENCOUNTER — Other Ambulatory Visit (HOSPITAL_COMMUNITY): Payer: Self-pay | Admitting: Adult Health

## 2021-09-04 ENCOUNTER — Other Ambulatory Visit (HOSPITAL_COMMUNITY): Payer: Self-pay | Admitting: Family Medicine

## 2021-09-04 DIAGNOSIS — Z1231 Encounter for screening mammogram for malignant neoplasm of breast: Secondary | ICD-10-CM

## 2021-10-12 ENCOUNTER — Ambulatory Visit (HOSPITAL_COMMUNITY)
Admission: RE | Admit: 2021-10-12 | Discharge: 2021-10-12 | Disposition: A | Discharge status: Home / Self Care | Payer: 59 | Source: Ambulatory Visit | Attending: Adult Health | Admitting: Adult Health

## 2021-10-12 DIAGNOSIS — Z1231 Encounter for screening mammogram for malignant neoplasm of breast: Secondary | ICD-10-CM | POA: Insufficient documentation

## 2021-11-27 ENCOUNTER — Telehealth: Payer: Self-pay | Admitting: Adult Health

## 2021-11-27 MED ORDER — ESCITALOPRAM OXALATE 10 MG PO TABS: 10.0000 mg | ORAL_TABLET | Freq: Every day | ORAL | 2 refills | Status: DC

## 2021-11-27 NOTE — Telephone Encounter (Signed)
Patient is calling stating that she is not sure that the buspirone may not be working for her and wants to know if you would call her

## 2021-11-27 NOTE — Addendum Note (Signed)
Addended by: Derrek Monaco A on: 11/27/2021 02:37 PM   Modules accepted: Orders

## 2021-11-27 NOTE — Telephone Encounter (Signed)
Feels more stressed taking Buspar,her dad died and sister had break down, will add lexapro and continue the Buspar, and let me know in about 4 weeks how she is doing.

## 2021-12-19 ENCOUNTER — Other Ambulatory Visit: Payer: Self-pay | Admitting: Adult Health

## 2022-02-04 ENCOUNTER — Other Ambulatory Visit: Payer: Self-pay | Admitting: Adult Health

## 2022-02-18 ENCOUNTER — Other Ambulatory Visit: Payer: Self-pay | Admitting: Adult Health

## 2022-03-04 ENCOUNTER — Encounter: Payer: Self-pay | Admitting: *Deleted

## 2022-03-04 NOTE — Patient Instructions (Signed)
  Procedure: colonoscopy  Estimated body mass index is 26.22 kg/m as calculated from the following:   Height as of 07/02/21: '5\' 3"'$  (1.6 m).   Weight as of 07/02/21: 148 lb (67.1 kg).   Have you had a colonoscopy before?  07/19/16, Dr. Oneida Alar  Do you have family history of colon cancer?  Yes, father  Previous colonoscopy with polyps removed? yes  Do you have a history colorectal cancer?   no  Are you diabetic?  no  Do you have a prosthetic or mechanical heart valve? no  Do you have a pacemaker/defibrillator?   no  Have you had endocarditis/atrial fibrillation?  no  Do you use supplemental oxygen/CPAP?  no  Have you had joint replacement within the last 12 months?  no  Do you tend to be constipated or have to use laxatives?  no   Do you have history of alcohol use? If yes, how much and how often.  no  Do you have history or are you using drugs? If yes, what do are you  using?  no  Have you ever had a stroke/heart attack?  no  Have you ever had a heart or other vascular stent placed,?no  Do you take weight loss medication? no  female patients,: have you had a hysterectomy? no                              are you post menopausal?  no                              do you still have your menstrual cycle? no    Date of last menstrual period? no  Do you take any blood-thinning medications such as: (Plavix, aspirin, Coumadin, Aggrenox, Brilinta, Xarelto, Eliquis, Pradaxa, Savaysa or Effient)? no  If yes we need the name, milligram, dosage and who is prescribing doctor:               Current Outpatient Medications  Medication Sig Dispense Refill   busPIRone (BUSPAR) 5 MG tablet TAKE 1 TABLET BY MOUTH THREE TIMES DAILY 90 tablet 2   escitalopram (LEXAPRO) 10 MG tablet Take 1 tablet by mouth once daily 30 tablet 6   losartan-hydrochlorothiazide (HYZAAR) 50-12.5 MG tablet Take 1 tablet by mouth once daily 30 tablet 6   valACYclovir (VALTREX) 1000 MG tablet Take 1 tablet (1,000 mg  total) by mouth daily as needed. 90 tablet 3   No current facility-administered medications for this visit.    Allergies  Allergen Reactions   Erythromycin Other (See Comments)    Gets a UTI every time.   Lisinopril Other (See Comments)    cough   Phenergan [Promethazine Hcl]     25 m iv made her feel jittery and nauseated. TOLERATED 6.25 MG IV.

## 2022-04-02 NOTE — Progress Notes (Signed)
Ok to schedule. ASA 2. Will need BMET due to HCTZ.

## 2022-04-06 ENCOUNTER — Other Ambulatory Visit: Payer: Self-pay | Admitting: *Deleted

## 2022-04-06 ENCOUNTER — Telehealth: Payer: Self-pay | Admitting: *Deleted

## 2022-04-06 ENCOUNTER — Encounter: Payer: Self-pay | Admitting: *Deleted

## 2022-04-06 DIAGNOSIS — Z86010 Personal history of colonic polyps: Secondary | ICD-10-CM

## 2022-04-06 MED ORDER — PEG 3350-KCL-NA BICARB-NACL 420 G PO SOLR: 4000.0000 mL | Freq: Once | ORAL | 0 refills | Status: AC

## 2022-04-06 NOTE — Progress Notes (Signed)
Questionnaire from recall,no referral needed 

## 2022-04-06 NOTE — Telephone Encounter (Signed)
Pt left vm that she needed to reschedule her colonoscopy on 04/28/22.  [place]

## 2022-04-06 NOTE — Progress Notes (Signed)
CALLED PT. SCHEDULED WITH Dr. Abbey Chatters on 1/30 at 10:30am. Aware will send prep rx to [place]. Aware needs labs prior. Wants done at [place]. Order placed. Instructions mailed.

## 2022-04-06 NOTE — Progress Notes (Signed)
.  ques 

## 2022-04-06 NOTE — Addendum Note (Signed)
Addended by: Cheron Every on: 04/06/2022 10:56 AM   Modules accepted: Orders

## 2022-04-20 ENCOUNTER — Encounter: Payer: Self-pay | Admitting: *Deleted

## 2022-04-20 NOTE — Telephone Encounter (Signed)
Pt called and needed to reschedule her colonoscopy. She has been rescheduled until 05/11/22 at 10 am. New instructions mailed.

## 2022-05-07 ENCOUNTER — Encounter: Payer: Self-pay | Admitting: *Deleted

## 2022-05-07 ENCOUNTER — Other Ambulatory Visit: Payer: Self-pay | Admitting: Adult Health

## 2022-05-07 ENCOUNTER — Telehealth: Payer: Self-pay | Admitting: *Deleted

## 2022-05-07 ENCOUNTER — Other Ambulatory Visit: Payer: Self-pay | Admitting: *Deleted

## 2022-05-07 DIAGNOSIS — Z86010 Personal history of colonic polyps: Secondary | ICD-10-CM | POA: Diagnosis not present

## 2022-05-07 MED ORDER — NA SULFATE-K SULFATE-MG SULF 17.5-3.13-1.6 GM/177ML PO SOLN: ORAL | 0 refills | Status: DC

## 2022-05-07 NOTE — Telephone Encounter (Signed)
Pt called and stated she had not gotten the instructions for her procedure on 05/11/22. She also requested to have the same prep as last time. Wants instructions to be faxed to her and sent in the prep she had before.

## 2022-05-08 LAB — BASIC METABOLIC PANEL
BUN/Creatinine Ratio: 16 (ref 9–23)
BUN: 12 mg/dL (ref 6–24)
CO2: 22 mmol/L (ref 20–29)
Calcium: 9.1 mg/dL (ref 8.7–10.2)
Chloride: 99 mmol/L (ref 96–106)
Creatinine, Ser: 0.77 mg/dL (ref 0.57–1.00)
Glucose: 99 mg/dL (ref 70–99)
Potassium: 4.2 mmol/L (ref 3.5–5.2)
Sodium: 141 mmol/L (ref 134–144)
eGFR: 89 mL/min/{1.73_m2} (ref >59)

## 2022-05-11 ENCOUNTER — Ambulatory Visit (HOSPITAL_COMMUNITY): Payer: 59 | Admitting: Anesthesiology

## 2022-05-11 ENCOUNTER — Ambulatory Visit (HOSPITAL_BASED_OUTPATIENT_CLINIC_OR_DEPARTMENT_OTHER): Payer: 59 | Admitting: Anesthesiology

## 2022-05-11 ENCOUNTER — Encounter (HOSPITAL_COMMUNITY): Payer: Self-pay

## 2022-05-11 ENCOUNTER — Ambulatory Visit (HOSPITAL_COMMUNITY)
Admission: RE | Admit: 2022-05-11 | Discharge: 2022-05-11 | Disposition: A | Discharge status: Home / Self Care | Payer: 59 | Source: Ambulatory Visit | Attending: Internal Medicine | Admitting: Internal Medicine

## 2022-05-11 ENCOUNTER — Other Ambulatory Visit: Payer: Self-pay

## 2022-05-11 ENCOUNTER — Encounter (HOSPITAL_COMMUNITY)
Admission: RE | Disposition: A | Discharge status: Home / Self Care | Payer: Self-pay | Source: Ambulatory Visit | Attending: Internal Medicine

## 2022-05-11 DIAGNOSIS — Z1211 Encounter for screening for malignant neoplasm of colon: Secondary | ICD-10-CM

## 2022-05-11 DIAGNOSIS — Z800 Family history of malignant neoplasm of digestive organs: Secondary | ICD-10-CM | POA: Insufficient documentation

## 2022-05-11 DIAGNOSIS — K5730 Diverticulosis of large intestine without perforation or abscess without bleeding: Secondary | ICD-10-CM | POA: Insufficient documentation

## 2022-05-11 DIAGNOSIS — F419 Anxiety disorder, unspecified: Secondary | ICD-10-CM | POA: Insufficient documentation

## 2022-05-11 DIAGNOSIS — K648 Other hemorrhoids: Secondary | ICD-10-CM | POA: Diagnosis not present

## 2022-05-11 DIAGNOSIS — R69 Illness, unspecified: Secondary | ICD-10-CM | POA: Diagnosis not present

## 2022-05-11 DIAGNOSIS — I10 Essential (primary) hypertension: Secondary | ICD-10-CM | POA: Diagnosis not present

## 2022-05-11 HISTORY — DX: Anxiety disorder, unspecified: F41.9

## 2022-05-11 HISTORY — PX: COLONOSCOPY WITH PROPOFOL: SHX5780

## 2022-05-11 SURGERY — COLONOSCOPY WITH PROPOFOL
Anesthesia: General

## 2022-05-11 MED ORDER — STERILE WATER FOR IRRIGATION IR SOLN
Status: DC | PRN
  Administered 2022-05-11: .6 mL

## 2022-05-11 MED ORDER — PROPOFOL 500 MG/50ML IV EMUL
INTRAVENOUS | Status: DC | PRN
  Administered 2022-05-11: 150 ug/kg/min via INTRAVENOUS

## 2022-05-11 MED ORDER — PROPOFOL 10 MG/ML IV BOLUS
INTRAVENOUS | Status: DC | PRN
  Administered 2022-05-11: 100 mg via INTRAVENOUS

## 2022-05-11 MED ORDER — LIDOCAINE HCL 1 % IJ SOLN
INTRAMUSCULAR | Status: DC | PRN
  Administered 2022-05-11: 50 mg via INTRADERMAL

## 2022-05-11 MED ORDER — LACTATED RINGERS IV SOLN: INTRAVENOUS | Status: DC

## 2022-05-11 NOTE — Discharge Instructions (Addendum)
  Colonoscopy Discharge Instructions  Read the instructions outlined below and refer to this sheet in the next few weeks. These discharge instructions provide you with general information on caring for yourself after you leave the hospital. Your doctor may also give you specific instructions. While your treatment has been planned according to the most current medical practices available, unavoidable complications occasionally occur.   ACTIVITY You may resume your regular activity, but move at a slower pace for the next 24 hours.  Take frequent rest periods for the next 24 hours.  Walking will help get rid of the air and reduce the bloated feeling in your belly (abdomen).  No driving for 24 hours (because of the medicine (anesthesia) used during the test).   Do not sign any important legal documents or operate any machinery for 24 hours (because of the anesthesia used during the test).  NUTRITION Drink plenty of fluids.  You may resume your normal diet as instructed by your doctor.  Begin with a light meal and progress to your normal diet. Heavy or fried foods are harder to digest and may make you feel sick to your stomach (nauseated).  Avoid alcoholic beverages for 24 hours or as instructed.  MEDICATIONS You may resume your normal medications unless your doctor tells you otherwise.  WHAT YOU CAN EXPECT TODAY Some feelings of bloating in the abdomen.  Passage of more gas than usual.  Spotting of blood in your stool or on the toilet paper.  IF YOU HAD POLYPS REMOVED DURING THE COLONOSCOPY: No aspirin products for 7 days or as instructed.  No alcohol for 7 days or as instructed.  Eat a soft diet for the next 24 hours.  FINDING OUT THE RESULTS OF YOUR TEST Not all test results are available during your visit. If your test results are not back during the visit, make an appointment with your caregiver to find out the results. Do not assume everything is normal if you have not heard from your  caregiver or the medical facility. It is important for you to follow up on all of your test results.  SEEK IMMEDIATE MEDICAL ATTENTION IF: You have more than a spotting of blood in your stool.  Your belly is swollen (abdominal distention).  You are nauseated or vomiting.  You have a temperature over 101.  You have abdominal pain or discomfort that is severe or gets worse throughout the day.   Your colonoscopy was relatively unremarkable.  I did not find any polyps or evidence of colon cancer.  I recommend repeating colonoscopy in 5 years for colon cancer screening purposes.  You do have diverticulosis and internal hemorrhoids. I would recommend increasing fiber in your diet or adding OTC Benefiber/Metamucil. Be sure to drink at least 4 to 6 glasses of water daily.   If you would like to set up appt to see [place] in clinic to discuss your chronic abdominal discomfort/pressure, we can set this [place], otherwise follow up as needed.    I hope you have a great rest of your week!  Elon Alas. Abbey Chatters, D.O. Gastroenterology and Hepatology [facility]

## 2022-05-11 NOTE — H&P (Signed)
Primary Care Physician:  Lemmie Evens, MD Primary Gastroenterologist:  Dr. Abbey Chatters  Pre-Procedure History & Physical: HPI:  Shannon Lindsey is a 57 y.o. female is here for a colonoscopy to be performed for high risk colon cancer screening purposes, family history of colon cancer in father  Past Medical History:  Diagnosis Date   Abnormal Pap smear    Anxiety    Burning with urination 09/29/2015   Cancer ([place])    squamous cell chest   Carpal tunnel syndrome, bilateral 09/29/2015   Diverticulitis    H/O hiatal hernia    HSV-1 (herpes simplex virus 1) infection    HSV-2 (herpes simplex virus 2) infection    Hyperlipidemia    Hypertension    Vaginal dryness 07/17/2014   Vaginal Pap smear, abnormal    Vitamin D deficiency 09/09/2015   Warts, genital     Past Surgical History:  Procedure Laterality Date   CESAREAN SECTION     COLONOSCOPY N/A 07/19/2016   Dr. Oneida Alar: 6 mm polyp, benign, diverticulosis involving rectosigmoid colon and sigmoid colon, small internal hemorrhoids.  Due to family history of colon cancer in a first-degree relative at early age, plans for next colonoscopy 5 years.   COLONOSCOPY WITH ESOPHAGOGASTRODUODENOSCOPY (EGD)  2011   Dr. Oneida Alar: small hh, gastritis bx negative for h.pylori, diverticulosis of colon, two hyperplastic colon polyps removed. TCS in 5 years due to father having colon cancer at age 28   COLPOSCOPY W/ BIOPSY / CURETTAGE     EXPLORATORY LAPAROTOMY     MELANOMA EXCISION Right 12/25/2012   Procedure: wide EXCISION squamous cell carcinoma right chest ;  Surgeon: Zenovia Jarred, MD;  Location: [place];  Service: General;  Laterality: Right;   MOLE REMOVAL     has had 30 + removed not cancer   TONSILLECTOMY     TUBAL LIGATION      Prior to Admission medications   Medication Sig Start Date End Date Taking? Authorizing Provider  acetaminophen (TYLENOL) 500 MG tablet Take 500 mg by mouth every 6 (six) hours as needed (pain.).   Yes [provider]  busPIRone (BUSPAR) 5 MG tablet TAKE 1 TABLET BY MOUTH THREE TIMES DAILY Patient taking differently: Take 5 mg by mouth 3 (three) times daily as needed (anxiety). 12/21/21  Yes Estill Dooms, NP  escitalopram (LEXAPRO) 10 MG tablet Take 1 tablet by mouth once daily 02/22/22  Yes Estill Dooms, NP  losartan-hydrochlorothiazide [facility]) 50-12.5 MG tablet Take 1 tablet by mouth once daily 02/04/22  Yes Estill Dooms, NP  Na Sulfate-K Sulfate-Mg Sulf 17.5-3.13-1.6 GM/177ML SOLN As directed 05/07/22   Eloise Harman, DO  valACYclovir (VALTREX) 1000 MG tablet TAKE 1 TABLET (1,000 MG TOTAL) BY MOUTH DAILY AS NEEDED. 05/07/22   Estill Dooms, NP    Allergies as of 04/06/2022 - Review Complete 07/02/2021  Allergen Reaction Noted   Erythromycin Other (See Comments) 07/12/2012   Lisinopril Other (See Comments) 12/20/2016   Phenergan [promethazine hcl]  07/19/2016    Family History  Problem Relation Age of Onset   Cancer Father        colon and lung   COPD Father    Colon cancer Father 29   Atrial fibrillation Mother    Diabetes Mother    Hyperlipidemia Mother    Hypertension Mother    Stroke Sister    Heart attack Brother    Other Daughter        benign pituitary tumor  Ovarian cancer Maternal Aunt     Social History   Socioeconomic History   Marital status: Legally Separated    Spouse name: Not on file   Number of children: Not on file   Years of education: Not on file   Highest education level: Not on file  Occupational History   Not on file  Tobacco Use   Smoking status: Never   Smokeless tobacco: Never  Vaping Use   Vaping Use: Never used  Substance and Sexual Activity   Alcohol use: Yes    Comment: occ.   Drug use: No   Sexual activity: Not Currently    Birth control/protection: Post-menopausal, Surgical    Comment: tubal  Other Topics Concern   Not on file  Social History Narrative   Not on file   Social Determinants of  Health   Financial Resource Strain: Medium Risk (05/22/2021)   Overall Financial Resource Strain (CARDIA)    Difficulty of Paying Living Expenses: Somewhat hard  Food Insecurity: No Food Insecurity (05/22/2021)   Hunger Vital Sign    Worried About Running Out of Food in the Last Year: Never true    Ran Out of Food in the Last Year: Never true  Transportation Needs: No Transportation Needs (05/22/2021)   PRAPARE - Hydrologist (Medical): No    Lack of Transportation (Non-Medical): No  Physical Activity: Insufficiently Active (05/22/2021)   Exercise Vital Sign    Days of Exercise per Week: 2 days    Minutes of Exercise per Session: 30 min  Stress: Stress Concern Present (05/22/2021)   [place]    Feeling of Stress : Very much  Social Connections: Moderately Isolated (05/22/2021)   Social Connection and Isolation Panel [NHANES]    Frequency of Communication with Friends and Family: More than three times a week    Frequency of Social Gatherings with Friends and Family: Twice a week    Attends Religious Services: 1 to 4 times per year    Active Member of [employer] or Organizations: No    Attends Archivist Meetings: Never    Marital Status: Separated  Intimate Partner Violence: Not At Risk (05/22/2021)   Humiliation, Afraid, Rape, and Kick questionnaire    Fear of Current or Ex-Partner: No    Emotionally Abused: No    Physically Abused: No    Sexually Abused: No    Review of Systems: See HPI, otherwise negative ROS  Physical Exam: Vital signs in last 24 hours: Temp:  [98.9 F (37.2 C)] 98.9 F (37.2 C) (02/13 0845) Pulse Rate:  [77] 77 (02/13 0845) Resp:  [18] 18 (02/13 0845) BP: (134)/(65) 134/65 (02/13 0845) SpO2:  [97 %] 97 % (02/13 0845) Weight:  [68 kg] 68 kg (02/13 0845)   General:   Alert,  Well-developed, well-nourished, pleasant and cooperative in NAD Head:   Normocephalic and atraumatic. Eyes:  Sclera clear, no icterus.   Conjunctiva pink. Ears:  Normal auditory acuity. Nose:  No deformity, discharge,  or lesions. Mouth:  No deformity or lesions, dentition normal. Neck:  Supple; no masses or thyromegaly. Lungs:  Clear throughout to auscultation.   No wheezes, crackles, or rhonchi. No acute distress. Heart:  Regular rate and rhythm; no murmurs, clicks, rubs,  or gallops. Abdomen:  Soft, nontender and nondistended. No masses, hepatosplenomegaly or hernias noted. Normal bowel sounds, without guarding, and without rebound.   Msk:  Symmetrical without gross deformities.  Normal posture. Extremities:  Without clubbing or edema. Neurologic:  Alert and  oriented x4;  grossly normal neurologically. Skin:  Intact without significant lesions or rashes. Cervical Nodes:  No significant cervical adenopathy. Psych:  Alert and cooperative. Normal mood and affect.  Impression/Plan: ROSANGELA GARRETTE is here for a colonoscopy to be performed for high risk colon cancer screening purposes, family history of colon cancer in father  The risks of the procedure including infection, bleed, or perforation as well as benefits, limitations, alternatives and imponderables have been reviewed with the patient. Questions have been answered. All parties agreeable.

## 2022-05-11 NOTE — Op Note (Signed)
[facility] Patient Name: Shannon Lindsey Procedure Date: 05/11/2022 9:11 AM MRN: [id number] Date of Birth: [birth year] Attending MD: Elon Alas. Edgar Frisk, [id number] CSN: [id number] Age: 57 Admit Type: Outpatient Procedure:                Colonoscopy Indications:              Screening in patient at increased risk: Colorectal                            cancer in father before age 78 Providers:                Elon Alas. Abbey Chatters, DO, Janeece Riggers, RN, Ladoris Gene                            Technician, Technician Referring MD:              Medicines:                See the Anesthesia note for documentation of the                            administered medications Complications:            No immediate complications. Estimated Blood Loss:     Estimated blood loss: none. Procedure:                Pre-Anesthesia Assessment:                           - The anesthesia plan was to use monitored                            anesthesia care (MAC).                           After obtaining informed consent, the colonoscope                            was passed under direct vision. Throughout the                            procedure, the patient's blood pressure, pulse, and                            oxygen saturations were monitored continuously. The                            [id number]) scope was introduced through                            the anus and advanced to the the cecum, identified                            by appendiceal orifice and ileocecal valve. The                            colonoscopy was performed  without difficulty. The                            patient tolerated the procedure well. The quality                            of the bowel preparation was evaluated using the                            BBPS [facility] Bowel Preparation Scale) with scores                            of: Right Colon = 2 (minor amount of residual                            staining, small  fragments of stool and/or opaque                            liquid, but mucosa seen well), Transverse Colon = 3                            (entire mucosa seen well with no residual staining,                            small fragments of stool or opaque liquid) and Left                            Colon = 3 (entire mucosa seen well with no residual                            staining, small fragments of stool or opaque                            liquid). The total BBPS score equals 8. The quality                            of the bowel preparation was good. Scope In: 9:26:12 AM Scope Out: 9:38:26 AM Scope Withdrawal Time: 0 hours 10 minutes 8 seconds  Total Procedure Duration: 0 hours 12 minutes 14 seconds  Findings:      The perianal and digital rectal examinations were normal.      Non-bleeding internal hemorrhoids were found during endoscopy.      Multiple large-mouthed and small-mouthed diverticula were found in the       sigmoid colon.      The exam was otherwise without abnormality. Impression:               - Non-bleeding internal hemorrhoids.                           - Diverticulosis in the sigmoid colon.                           - The examination was otherwise normal.                           -  No specimens collected. Moderate Sedation:      Per Anesthesia Care Recommendation:           - Patient has a contact number available for                            emergencies. The signs and symptoms of potential                            delayed complications were discussed with the                            patient. Return to normal activities tomorrow.                            Written discharge instructions were provided to the                            patient.                           - Resume previous diet.                           - Continue present medications.                           - Repeat colonoscopy in 5 years for screening                             purposes.                           - Return to GI clinic PRN. Procedure Code(s):        --- Professional ---                           [id number], Colorectal cancer screening; colonoscopy on                            individual at high risk Diagnosis Code(s):        --- Professional ---                           Z80.0, Family history of malignant neoplasm of                            digestive organs                           K64.8, Other hemorrhoids                           K57.30, Diverticulosis of large intestine without                            perforation or abscess without bleeding CPT copyright 2022 American Medical Association. All rights reserved.  The codes documented in this report are preliminary and upon coder review may  be revised to meet current compliance requirements. Elon Alas. Abbey Chatters, DO [place] Abbey Chatters, DO 05/11/2022 9:42:22 AM This report has been signed electronically. Number of Addenda: 0

## 2022-05-11 NOTE — Transfer of Care (Signed)
Immediate Anesthesia Transfer of Care Note  Patient: Shannon Lindsey  Procedure(s) Performed: COLONOSCOPY WITH PROPOFOL  Patient Location: Endoscopy Unit  Anesthesia Type:General  Level of Consciousness: awake  Airway & Oxygen Therapy: Patient Spontanous Breathing  Post-op Assessment: Report given to RN  Post vital signs: Reviewed and stable  Last Vitals:  Vitals Value Taken Time  BP    Temp    Pulse    Resp    SpO2      Last Pain:  Vitals:   05/11/22 0925  TempSrc: Oral  PainSc: 0-No pain      Patients Stated Pain Goal: 7 ([id number])  Complications: No notable events documented.

## 2022-05-11 NOTE — Anesthesia Preprocedure Evaluation (Signed)
Anesthesia Evaluation  Patient identified by MRN, date of birth, ID band Patient awake    Reviewed: Allergy & Precautions, H&P , NPO status , Patient's Chart, lab work & pertinent test results, reviewed documented beta blocker date and time   Airway Mallampati: II  TM Distance: >3 FB Neck ROM: full    Dental no notable dental hx.    Pulmonary neg pulmonary ROS   Pulmonary exam normal breath sounds clear to auscultation       Cardiovascular Exercise Tolerance: Good hypertension, negative cardio ROS  Rhythm:regular Rate:Normal     Neuro/Psych   Anxiety      Neuromuscular disease negative neurological ROS  negative psych ROS   GI/Hepatic negative GI ROS, Neg liver ROS, hiatal hernia,,,  Endo/Other  negative endocrine ROS    Renal/GU negative Renal ROS  negative genitourinary   Musculoskeletal   Abdominal   Peds  Hematology negative hematology ROS (+)   Anesthesia Other Findings   Reproductive/Obstetrics negative OB ROS                             Anesthesia Physical Anesthesia Plan  ASA: 2  Anesthesia Plan: General   Post-op Pain Management:    Induction:   PONV Risk Score and Plan: Propofol infusion  Airway Management Planned:   Additional Equipment:   Intra-op Plan:   Post-operative Plan:   Informed Consent: I have reviewed the patients History and Physical, chart, labs and discussed the procedure including the risks, benefits and alternatives for the proposed anesthesia with the patient or authorized representative who has indicated his/her understanding and acceptance.     Dental Advisory Given  Plan Discussed with: CRNA  Anesthesia Plan Comments:        Anesthesia Quick Evaluation

## 2022-05-11 NOTE — Anesthesia Postprocedure Evaluation (Signed)
Anesthesia Post Note  Patient: Shannon Lindsey  Procedure(s) Performed: COLONOSCOPY WITH PROPOFOL  Patient location during evaluation: Endoscopy Anesthesia Type: General Level of consciousness: awake and alert Pain management: pain level controlled Vital Signs Assessment: post-procedure vital signs reviewed and stable Respiratory status: spontaneous breathing Cardiovascular status: blood pressure returned to baseline and stable Postop Assessment: no apparent nausea or vomiting Anesthetic complications: no   No notable events documented.   Last Vitals:  Vitals:   05/11/22 0845  BP: 134/65  Pulse: 77  Resp: 18  Temp: 37.2 C  SpO2: 97%    Last Pain:  Vitals:   05/11/22 0925  TempSrc: Oral  PainSc: 0-No pain                 Kortnee Bas

## 2022-05-17 ENCOUNTER — Encounter (HOSPITAL_COMMUNITY): Payer: Self-pay | Admitting: Internal Medicine

## 2022-10-15 ENCOUNTER — Other Ambulatory Visit: Payer: Self-pay | Admitting: Adult Health

## 2022-12-04 ENCOUNTER — Other Ambulatory Visit: Payer: Self-pay | Admitting: Adult Health

## 2022-12-09 ENCOUNTER — Other Ambulatory Visit (HOSPITAL_COMMUNITY): Payer: Self-pay | Admitting: Adult Health

## 2022-12-09 DIAGNOSIS — Z1231 Encounter for screening mammogram for malignant neoplasm of breast: Secondary | ICD-10-CM

## 2022-12-10 ENCOUNTER — Ambulatory Visit (INDEPENDENT_AMBULATORY_CARE_PROVIDER_SITE_OTHER): Payer: 59 | Admitting: Adult Health

## 2022-12-10 ENCOUNTER — Encounter: Payer: Self-pay | Admitting: Adult Health

## 2022-12-10 VITALS — BP 131/83 | HR 69 | Ht 63.0 in | Wt 154.0 lb

## 2022-12-10 DIAGNOSIS — F419 Anxiety disorder, unspecified: Secondary | ICD-10-CM

## 2022-12-10 DIAGNOSIS — Z1211 Encounter for screening for malignant neoplasm of colon: Secondary | ICD-10-CM | POA: Diagnosis not present

## 2022-12-10 DIAGNOSIS — Z01419 Encounter for gynecological examination (general) (routine) without abnormal findings: Secondary | ICD-10-CM | POA: Diagnosis not present

## 2022-12-10 DIAGNOSIS — N9089 Other specified noninflammatory disorders of vulva and perineum: Secondary | ICD-10-CM | POA: Diagnosis not present

## 2022-12-10 DIAGNOSIS — Z780 Asymptomatic menopausal state: Secondary | ICD-10-CM

## 2022-12-10 DIAGNOSIS — E7800 Pure hypercholesterolemia, unspecified: Secondary | ICD-10-CM | POA: Diagnosis not present

## 2022-12-10 DIAGNOSIS — I10 Essential (primary) hypertension: Secondary | ICD-10-CM

## 2022-12-10 LAB — HEMOCCULT GUIAC POC 1CARD (OFFICE): Fecal Occult Blood, POC: NEGATIVE

## 2022-12-10 MED ORDER — ESCITALOPRAM OXALATE 10 MG PO TABS: 10.0000 mg | ORAL_TABLET | Freq: Every day | ORAL | 3 refills | Status: DC

## 2022-12-10 MED ORDER — BUSPIRONE HCL 5 MG PO TABS: 5.0000 mg | ORAL_TABLET | Freq: Three times a day (TID) | ORAL | 6 refills | Status: DC

## 2022-12-10 MED ORDER — LOSARTAN POTASSIUM-HCTZ 50-12.5 MG PO TABS: 1.0000 | ORAL_TABLET | Freq: Every day | ORAL | 3 refills | Status: DC

## 2022-12-10 NOTE — Progress Notes (Signed)
Patient ID: Shannon Lindsey, female   DOB: 1965-09-17, 57 y.o.   MRN: [id number] History of Present Illness: Shannon Lindsey is a 56 year old white female,separated, PM in for a well woman gyn exam. She has been help take care of her sister and her mom and working still.      Component Value Date/Time   DIAGPAP  05/22/2021 0842    - Negative for intraepithelial lesion or malignancy (NILM)   DIAGPAP  04/24/2018 0000    NEGATIVE FOR INTRAEPITHELIAL LESIONS OR MALIGNANCY.   HPVHIGH Negative 05/22/2021 0842   ADEQPAP  05/22/2021 0842    Satisfactory for evaluation; transformation zone component ABSENT.   ADEQPAP  04/24/2018 0000    Satisfactory for evaluation  endocervical/transformation zone component ABSENT.     Current Medications, Allergies, Past Medical History, Past Surgical History, Family History and Social History were reviewed in [employer] record.     Review of Systems: Patient denies any headaches, hearing loss, fatigue, blurred vision, shortness of breath, chest pain, abdominal pain, problems with bowel movements, urination, or intercourse. No joint pain or mood swings.  Denies any vaginal bleeding    Physical Exam:BP 131/83 (BP Location: Right Arm, Patient Position: Sitting, Cuff Size: Normal)   Pulse 69   Ht 5\' 3"  (1.6 m)   Wt 154 lb (69.9 kg)   BMI 27.28 kg/m   General:  Well developed, well nourished, no acute distress Skin:  Warm and dry Neck:  Midline trachea, normal thyroid, good ROM, no lymphadenopathy Lungs; Clear to auscultation bilaterally Breast:  No dominant palpable mass, retraction, or nipple discharge Cardiovascular: Regular rate and rhythm Abdomen:  Soft, non tender, no hepatosplenomegaly Pelvic:  External genitalia is normal in appearance, has skin top right labia.  The vagina is pale. Urethra has no lesions or masses. The cervix is smooth.  Uterus is felt to be normal size, shape, and contour.  No adnexal masses or tenderness noted.Bladder  is non tender, no masses felt. Rectal: Good sphincter tone, no polyps, or hemorrhoids felt.  Hemoccult negative. Extremities/musculoskeletal:  No swelling or varicosities noted, no clubbing or cyanosis Psych:  No mood changes, alert and cooperative,seems happy AA is 5 Fall risk is low    12/10/2022    8:42 AM 05/22/2021    8:34 AM 01/18/2020    9:48 AM  Depression screen PHQ 2/9  Decreased Interest 2 1 0  Down, Depressed, Hopeless 2 1 0  PHQ - 2 Score 4 2 0  Altered sleeping 3 2 1   Tired, decreased energy 1 2 0  Change in appetite 2 2 0  Feeling bad or failure about yourself  0 0 0  Trouble concentrating 0 0 0  Moving slowly or fidgety/restless 0 0 0  Suicidal thoughts 0 0 0  PHQ-9 Score 10 8 1        12/10/2022    8:43 AM 05/22/2021    8:34 AM 01/18/2020    9:48 AM  GAD 7 : Generalized Anxiety Score  Nervous, Anxious, on Edge 1 2 1   Control/stop worrying 0 3 1  Worry too much - different things 1 3 1   Trouble relaxing 0 2 1  Restless 0 2 0  Easily annoyed or irritable 0 1 0  Afraid - awful might happen 1 2 1   Total GAD 7 Score 3 15 5       Upstream - 12/10/22 0841       Pregnancy Intention Screening   Does the patient want  to become pregnant in the next year? N/A    Does the patient's partner want to become pregnant in the next year? N/A    Would the patient like to discuss contraceptive options today? N/A      Contraception Wrap Up   Current Method Female Sterilization   PM   End Method Female Sterilization   PM   Contraception Counseling Provided No             Examination chaperoned by Malachy Mood LPN  Impression and Plan: 1. Encounter for well woman exam with routine gynecological exam Physical in 1 year Pap in 2026 Will check labs today Mammogram scheduled for 12/16/22, was negative 10/12/21. - CBC - Comprehensive metabolic panel - Lipid panel Colonoscopy per GI  Given name of Dr Durwin Nora for PCP Stay active  2. Encounter for screening fecal occult  blood testing Hemoccult was negative  - POCT occult blood stool  3. Skin tag of vulva  4. Anxiety Has not been taking Buspar will restart Continue lexapro 10 mg will refill   5. Essential hypertension Has missed some meds, will refill Hyzaar 50-12.5 mg   Meds ordered this encounter  Medications   busPIRone (BUSPAR) 5 MG tablet    Sig: Take 1 tablet (5 mg total) by mouth 3 (three) times daily.    Dispense:  90 tablet    Refill:  6    Order Specific Question:   Supervising Provider    Answer:   Duane Lope H [[id number]]   escitalopram (LEXAPRO) 10 MG tablet    Sig: Take 1 tablet (10 mg total) by mouth daily.    Dispense:  90 tablet    Refill:  3    Order Specific Question:   Supervising Provider    Answer:   Duane Lope H [[id number]]   losartan-hydrochlorothiazide (HYZAAR) 50-12.5 MG tablet    Sig: Take 1 tablet by mouth daily.    Dispense:  90 tablet    Refill:  3    Order Specific Question:   Supervising Provider    Answer:   Despina Hidden, LUTHER H [[id number]]     6. Postmenopausal Denies any vaginal bleeding  - Comprehensive metabolic panel  7. Elevated cholesterol - Lipid panel

## 2022-12-11 LAB — LIPID PANEL
Chol/HDL Ratio: 4 ratio (ref 0.0–4.4)
Cholesterol, Total: 259 mg/dL — ABNORMAL HIGH (ref 100–199)
HDL: 65 mg/dL (ref >39)
LDL Chol Calc (NIH): 182 mg/dL — ABNORMAL HIGH (ref 0–99)
Triglycerides: 72 mg/dL (ref 0–149)
VLDL Cholesterol Cal: 12 mg/dL (ref 5–40)

## 2022-12-11 LAB — CBC
Hematocrit: 44.1 % (ref 34.0–46.6)
Hemoglobin: 14.6 g/dL (ref 11.1–15.9)
MCH: 29.4 pg (ref 26.6–33.0)
MCHC: 33.1 g/dL (ref 31.5–35.7)
MCV: 89 fL (ref 79–97)
Platelets: 310 10*3/uL (ref 150–450)
RBC: 4.96 x10E6/uL (ref 3.77–5.28)
RDW: 13 % (ref 11.7–15.4)
WBC: 4.2 10*3/uL (ref 3.4–10.8)

## 2022-12-11 LAB — COMPREHENSIVE METABOLIC PANEL
ALT: 14 IU/L (ref 0–32)
AST: 16 IU/L (ref 0–40)
Albumin: 4.6 g/dL (ref 3.8–4.9)
Alkaline Phosphatase: 72 IU/L (ref 44–121)
BUN/Creatinine Ratio: 18 (ref 9–23)
BUN: 16 mg/dL (ref 6–24)
Bilirubin Total: 0.4 mg/dL (ref 0.0–1.2)
CO2: 26 mmol/L (ref 20–29)
Calcium: 9.7 mg/dL (ref 8.7–10.2)
Chloride: 100 mmol/L (ref 96–106)
Creatinine, Ser: 0.87 mg/dL (ref 0.57–1.00)
Globulin, Total: 2.4 g/dL (ref 1.5–4.5)
Glucose: 93 mg/dL (ref 70–99)
Potassium: 4.5 mmol/L (ref 3.5–5.2)
Sodium: 140 mmol/L (ref 134–144)
Total Protein: 7 g/dL (ref 6.0–8.5)
eGFR: 77 mL/min/{1.73_m2} (ref >59)

## 2022-12-14 ENCOUNTER — Telehealth: Payer: Self-pay | Admitting: *Deleted

## 2022-12-14 MED ORDER — ROSUVASTATIN CALCIUM 10 MG PO TABS: 10.0000 mg | ORAL_TABLET | Freq: Every day | ORAL | 3 refills | Status: DC

## 2022-12-14 NOTE — Telephone Encounter (Signed)
Pt aware of blood work results & voiced understanding. She is willing to start Crestor. Thanks! JSY

## 2022-12-14 NOTE — Telephone Encounter (Signed)
Will rx crestor 10 mg 1 daily and recheck labs CMP lipids in 3 months, placed in recall.

## 2022-12-14 NOTE — Telephone Encounter (Signed)
-----   Message from Cyril Mourning sent at 12/14/2022  8:44 AM EDT ----- Call Shaylah and let her know about labs you can read my note THX Jenn

## 2022-12-16 ENCOUNTER — Encounter (HOSPITAL_COMMUNITY): Payer: Self-pay

## 2022-12-16 ENCOUNTER — Ambulatory Visit (HOSPITAL_COMMUNITY)
Admission: RE | Admit: 2022-12-16 | Discharge: 2022-12-16 | Disposition: A | Discharge status: Home / Self Care | Payer: 59 | Source: Ambulatory Visit | Attending: Adult Health | Admitting: Adult Health

## 2022-12-16 DIAGNOSIS — Z1231 Encounter for screening mammogram for malignant neoplasm of breast: Secondary | ICD-10-CM | POA: Insufficient documentation

## 2022-12-17 ENCOUNTER — Telehealth: Payer: Self-pay | Admitting: *Deleted

## 2022-12-17 NOTE — Telephone Encounter (Signed)
-----   Message from Cyril Mourning sent at 12/17/2022  8:50 AM EDT ----- Let her know mammogram was negative. THX [employer]

## 2022-12-17 NOTE — Telephone Encounter (Signed)
Left message @ 12:21 pm that mammogram was negative. Next one due in 1 year. JSY

## 2023-05-21 ENCOUNTER — Other Ambulatory Visit: Payer: Self-pay | Admitting: Adult Health

## 2023-06-04 ENCOUNTER — Other Ambulatory Visit: Payer: Self-pay | Admitting: Adult Health

## 2023-08-26 ENCOUNTER — Other Ambulatory Visit: Payer: Self-pay | Admitting: Adult Health

## 2023-12-25 ENCOUNTER — Other Ambulatory Visit: Payer: Self-pay | Admitting: Adult Health

## 2024-01-03 ENCOUNTER — Other Ambulatory Visit (HOSPITAL_COMMUNITY): Payer: Self-pay | Admitting: Adult Health

## 2024-01-03 DIAGNOSIS — Z1231 Encounter for screening mammogram for malignant neoplasm of breast: Secondary | ICD-10-CM

## 2024-01-04 ENCOUNTER — Other Ambulatory Visit (HOSPITAL_COMMUNITY)
Admission: RE | Admit: 2024-01-04 | Discharge: 2024-01-04 | Disposition: A | Discharge status: Home / Self Care | Source: Ambulatory Visit | Attending: Adult Health | Admitting: Adult Health

## 2024-01-04 ENCOUNTER — Ambulatory Visit (INDEPENDENT_AMBULATORY_CARE_PROVIDER_SITE_OTHER): Admitting: Adult Health

## 2024-01-04 ENCOUNTER — Encounter: Payer: Self-pay | Admitting: Adult Health

## 2024-01-04 VITALS — BP 145/73 | HR 67 | Ht 63.0 in | Wt 156.0 lb

## 2024-01-04 DIAGNOSIS — Z1331 Encounter for screening for depression: Secondary | ICD-10-CM | POA: Diagnosis not present

## 2024-01-04 DIAGNOSIS — Z780 Asymptomatic menopausal state: Secondary | ICD-10-CM | POA: Diagnosis not present

## 2024-01-04 DIAGNOSIS — Z01419 Encounter for gynecological examination (general) (routine) without abnormal findings: Secondary | ICD-10-CM | POA: Diagnosis not present

## 2024-01-04 DIAGNOSIS — Z1151 Encounter for screening for human papillomavirus (HPV): Secondary | ICD-10-CM | POA: Diagnosis not present

## 2024-01-04 DIAGNOSIS — E7800 Pure hypercholesterolemia, unspecified: Secondary | ICD-10-CM | POA: Diagnosis not present

## 2024-01-04 DIAGNOSIS — N9089 Other specified noninflammatory disorders of vulva and perineum: Secondary | ICD-10-CM

## 2024-01-04 DIAGNOSIS — F419 Anxiety disorder, unspecified: Secondary | ICD-10-CM | POA: Diagnosis not present

## 2024-01-04 DIAGNOSIS — I10 Essential (primary) hypertension: Secondary | ICD-10-CM

## 2024-01-04 MED ORDER — VALACYCLOVIR HCL 1 G PO TABS: 1000.0000 mg | ORAL_TABLET | Freq: Every day | ORAL | 11 refills | Status: AC | PRN

## 2024-01-04 MED ORDER — ROSUVASTATIN CALCIUM 10 MG PO TABS: 10.0000 mg | ORAL_TABLET | Freq: Every day | ORAL | 3 refills | Status: AC

## 2024-01-04 MED ORDER — ESCITALOPRAM OXALATE 10 MG PO TABS: 10.0000 mg | ORAL_TABLET | Freq: Every day | ORAL | 3 refills | Status: AC

## 2024-01-04 MED ORDER — LOSARTAN POTASSIUM-HCTZ 50-12.5 MG PO TABS: 1.0000 | ORAL_TABLET | Freq: Every day | ORAL | 3 refills | Status: AC

## 2024-01-04 NOTE — Progress Notes (Signed)
 Patient ID: Shannon Lindsey, female   DOB: [birth year], 58 y.o.   MRN: [id number] History of Present Illness: Shannon Lindsey is a 58 year old white female,separated, PM in for a well woman gyn exam and pap.She works 6 days a week and cares for her mom who is 61. No current PCP.   Current Medications, Allergies, Past Medical History, Past Surgical History, Family History and Social History were reviewed in [employer] record.     Review of Systems: Patient denies any headaches, hearing loss, fatigue, blurred vision, shortness of breath, chest pain, abdominal pain, problems with bowel movements, urination, or intercourse. No joint pain or mood swings.  Denies any vaginal bleeding   Physical Exam:BP (!) 145/73 (BP Location: Left Arm, Patient Position: Sitting, Cuff Size: Normal)   Pulse 67   Ht 5' 3 (1.6 m)   Wt 156 lb (70.8 kg)   BMI 27.63 kg/m   General:  Well developed, well nourished, no acute distress Skin:  Warm and dry Neck:  Midline trachea, normal thyroid , good ROM, no lymphadenopathy, no carotid bruits heard  Lungs; Clear to auscultation bilaterally Breast:  No dominant palpable mass, retraction, or nipple discharge Cardiovascular: Regular rate and rhythm Abdomen:  Soft, non tender, no hepatosplenomegaly Pelvic:  External genitalia is normal in appearance, has flesh colored skin tag right vulva near mons pubis.  The vagina is pale. Urethra has no lesions or masses. The cervix is smooth, pap with HR HPV genotyping per.  Uterus is felt to be normal size, shape, and contour.  No adnexal masses or tenderness noted.Bladder is non tender, no masses felt. Rectal: Deferred  Extremities/musculoskeletal:  No swelling or varicosities noted, no clubbing or cyanosis Psych:  No mood changes, alert and cooperative,seems happy AA is 2 Fall risk is low    01/04/2024   10:34 AM 12/10/2022    8:42 AM 05/22/2021    8:34 AM  Depression screen PHQ 2/9  Decreased Interest 0 2 1  Down,  Depressed, Hopeless 0 2 1  PHQ - 2 Score 0 4 2  Altered sleeping 2 3 2   Tired, decreased energy 2 1 2   Change in appetite 0 2 2  Feeling bad or failure about yourself  0 0 0  Trouble concentrating 0 0 0  Moving slowly or fidgety/restless 0 0 0  Suicidal thoughts 0 0 0  PHQ-9 Score 4 10 8        01/04/2024   10:35 AM 12/10/2022    8:43 AM 05/22/2021    8:34 AM 01/18/2020    9:48 AM  GAD 7 : Generalized Anxiety Score  Nervous, Anxious, on Edge 1 1 2 1   Control/stop worrying 1 0 3 1  Worry too much - different things 1 1 3 1   Trouble relaxing 1 0 2 1  Restless 0 0 2 0  Easily annoyed or irritable 1 0 1 0  Afraid - awful might happen 3 1 2 1   Total GAD 7 Score 8 3 15 5     Upstream - 01/04/24 1040       Pregnancy Intention Screening   Does the patient want to become pregnant in the next year? N/A    Does the patient's partner want to become pregnant in the next year? N/A    Would the patient like to discuss contraceptive options today? N/A      Contraception Wrap Up   Current Method Female Sterilization   PM   End Method Female Sterilization  PM   Contraception Counseling Provided No           Examination chaperoned by Clarita Salt LPN  Impression and plan: 1. Encounter for gynecological examination with Papanicolaou smear of cervix (Primary) Pap sent Pap in 3 years if normal Physical in 1 year Check labs  Has mammogram scheduled for 01/09/24, last was 12/16/22 and negative - Cytology - PAP( [place]) - Lipid panel - Comprehensive metabolic panel with GFR - CBC She requests refill on valtrex  too Colonoscopy per GI  2. Essential hypertension She missed BP meds today but is taking hyzaar 50-12.5 mg 1 daily, will refill  - Comprehensive metabolic panel with GFR  Meds ordered this encounter  Medications   escitalopram  (LEXAPRO ) 10 MG tablet    Sig: Take 1 tablet (10 mg total) by mouth daily.    Dispense:  90 tablet    Refill:  3    Supervising Provider:    JAYNE MINDER H [[id number]]   rosuvastatin  (CRESTOR ) 10 MG tablet    Sig: Take 1 tablet (10 mg total) by mouth daily.    Dispense:  90 tablet    Refill:  3    Supervising Provider:   JAYNE MINDER H [[id number]]   losartan -hydrochlorothiazide  (HYZAAR) 50-12.5 MG tablet    Sig: Take 1 tablet by mouth daily.    Dispense:  90 tablet    Refill:  3    Supervising Provider:   JAYNE MINDER H [[id number]]   valACYclovir  (VALTREX ) 1000 MG tablet    Sig: Take 1 tablet (1,000 mg total) by mouth daily as needed.    Dispense:  30 tablet    Refill:  11    Supervising Provider:   JAYNE MINDER H [[id number]]    3. Postmenopausal Denies any vaginal bleeding   4. Elevated cholesterol Taking Crestor  10 mg 1 daily, will refill  - Lipid panel  5. Skin tag of vulva  6. Anxiety Happy with lexapro  10 mg 1 daily, will refill

## 2024-01-05 ENCOUNTER — Ambulatory Visit: Payer: Self-pay | Admitting: Adult Health

## 2024-01-05 LAB — LIPID PANEL
Chol/HDL Ratio: 2.7 ratio (ref 0.0–4.4)
Cholesterol, Total: 178 mg/dL (ref 100–199)
HDL: 65 mg/dL (ref >39)
LDL Chol Calc (NIH): 100 mg/dL — ABNORMAL HIGH (ref 0–99)
Triglycerides: 66 mg/dL (ref 0–149)
VLDL Cholesterol Cal: 13 mg/dL (ref 5–40)

## 2024-01-05 LAB — COMPREHENSIVE METABOLIC PANEL WITH GFR
ALT: 15 IU/L (ref 0–32)
AST: 20 IU/L (ref 0–40)
Albumin: 4.4 g/dL (ref 3.8–4.9)
Alkaline Phosphatase: 77 IU/L (ref 49–135)
BUN/Creatinine Ratio: 18 (ref 12–28)
BUN: 15 mg/dL (ref 8–27)
Bilirubin Total: 0.6 mg/dL (ref 0.0–1.2)
CO2: 25 mmol/L (ref 20–29)
Calcium: 9.6 mg/dL (ref 8.7–10.3)
Chloride: 101 mmol/L (ref 96–106)
Creatinine, Ser: 0.82 mg/dL (ref 0.57–1.00)
Globulin, Total: 2.2 g/dL (ref 1.5–4.5)
Glucose: 86 mg/dL (ref 70–99)
Potassium: 4.8 mmol/L (ref 3.5–5.2)
Sodium: 141 mmol/L (ref 134–144)
Total Protein: 6.6 g/dL (ref 6.0–8.5)
eGFR: 82 mL/min/1.73 (ref >59)

## 2024-01-05 LAB — CBC
Hematocrit: 46.2 % (ref 34.0–46.6)
Hemoglobin: 14.8 g/dL (ref 11.1–15.9)
MCH: 28.6 pg (ref 26.6–33.0)
MCHC: 32 g/dL (ref 31.5–35.7)
MCV: 89 fL (ref 79–97)
Platelets: 287 x10E3/uL (ref 150–450)
RBC: 5.18 x10E6/uL (ref 3.77–5.28)
RDW: 13.1 % (ref 11.7–15.4)
WBC: 5.3 x10E3/uL (ref 3.4–10.8)

## 2024-01-06 ENCOUNTER — Ambulatory Visit: Admitting: Adult Health

## 2024-01-06 NOTE — Telephone Encounter (Signed)
 Pt aware of lab results and voiced understanding. JSY

## 2024-01-06 NOTE — Telephone Encounter (Signed)
-----   Message from Delon Lewis sent at 01/05/2024  3:10 PM EDT ----- Please let her know about labs [facility]

## 2024-01-09 ENCOUNTER — Ambulatory Visit (HOSPITAL_COMMUNITY)

## 2024-01-09 LAB — CYTOLOGY - PAP
Adequacy: ABSENT
Comment: NEGATIVE
Diagnosis: NEGATIVE
High risk HPV: NEGATIVE

## 2024-01-09 NOTE — Telephone Encounter (Signed)
-----   Message from Shannon Lindsey sent at 01/09/2024 11:09 AM EDT ----- Please let her know about pap. THX

## 2024-01-09 NOTE — Telephone Encounter (Signed)
 Left message @ 12:35 pm, letting pt know pap was negative for HPV and malignancy. Next pap due in 3 years. JSY

## 2024-02-02 ENCOUNTER — Ambulatory Visit (HOSPITAL_COMMUNITY)

## 2024-03-19 ENCOUNTER — Ambulatory Visit (HOSPITAL_COMMUNITY)

## 2024-04-02 ENCOUNTER — Ambulatory Visit: Admitting: Adult Health
